# Patient Record
Sex: Male | Born: 1980 | Hispanic: No | Marital: Single | Smoking: Never smoker
Health system: Southern US, Community
[De-identification: ages and names within clinical notes are randomized; demographics above are authoritative.]

## PROBLEM LIST (undated history)

## (undated) DIAGNOSIS — K219 Gastro-esophageal reflux disease without esophagitis: Secondary | ICD-10-CM

## (undated) DIAGNOSIS — R519 Headache, unspecified: Secondary | ICD-10-CM

## (undated) DIAGNOSIS — G4730 Sleep apnea, unspecified: Secondary | ICD-10-CM

## (undated) DIAGNOSIS — D571 Sickle-cell disease without crisis: Secondary | ICD-10-CM

## (undated) DIAGNOSIS — J45909 Unspecified asthma, uncomplicated: Secondary | ICD-10-CM

## (undated) DIAGNOSIS — R51 Headache: Secondary | ICD-10-CM

## (undated) HISTORY — PX: TRACHEOSTOMY: SUR1362

## (undated) HISTORY — PX: OTHER SURGICAL HISTORY: SHX169

## (undated) HISTORY — PX: CHOLECYSTECTOMY: SHX55

---

## 2000-12-31 ENCOUNTER — Emergency Department (HOSPITAL_COMMUNITY): Admission: EM | Admit: 2000-12-31 | Discharge: 2000-12-31 | Payer: Self-pay

## 2008-09-03 ENCOUNTER — Ambulatory Visit: Payer: Self-pay

## 2010-05-17 ENCOUNTER — Ambulatory Visit: Payer: Self-pay | Admitting: Emergency Medicine

## 2011-01-07 ENCOUNTER — Emergency Department: Payer: Self-pay | Admitting: Emergency Medicine

## 2011-03-21 ENCOUNTER — Emergency Department: Payer: Self-pay | Admitting: Internal Medicine

## 2011-09-04 ENCOUNTER — Emergency Department: Payer: Self-pay | Admitting: Emergency Medicine

## 2012-05-24 ENCOUNTER — Emergency Department: Payer: Self-pay | Admitting: Emergency Medicine

## 2012-08-16 ENCOUNTER — Emergency Department: Payer: Self-pay | Admitting: Internal Medicine

## 2012-08-16 LAB — COMPREHENSIVE METABOLIC PANEL
Albumin: 4.1 g/dL (ref 3.4–5.0)
Alkaline Phosphatase: 83 U/L (ref 50–136)
Anion Gap: 4 — ABNORMAL LOW (ref 7–16)
BUN: 7 mg/dL (ref 7–18)
Co2: 29 mmol/L (ref 21–32)
Creatinine: 0.75 mg/dL (ref 0.60–1.30)
EGFR (African American): >60
Glucose: 101 mg/dL — ABNORMAL HIGH (ref 65–99)
Osmolality: 276 (ref 275–301)
SGPT (ALT): 60 U/L (ref 12–78)
Sodium: 139 mmol/L (ref 136–145)
Total Protein: 8.4 g/dL — ABNORMAL HIGH (ref 6.4–8.2)

## 2012-08-16 LAB — CBC
HCT: 35.9 % — ABNORMAL LOW (ref 40.0–52.0)
MCH: 34.5 pg — ABNORMAL HIGH (ref 26.0–34.0)
MCHC: 34.7 g/dL (ref 32.0–36.0)
MCV: 99 fL (ref 80–100)
RBC: 3.62 10*6/uL — ABNORMAL LOW (ref 4.40–5.90)

## 2012-08-16 LAB — RETICULOCYTES
Absolute Retic Count: 0.0703 10*6/uL
Reticulocyte: 1.94 %

## 2012-08-17 LAB — URINALYSIS, COMPLETE
Bacteria: NONE SEEN
Bilirubin,UR: NEGATIVE
Blood: NEGATIVE
Glucose,UR: NEGATIVE mg/dL (ref 0–75)
Ketone: NEGATIVE
Nitrite: NEGATIVE
Ph: 6 (ref 4.5–8.0)
Protein: NEGATIVE
RBC,UR: 1 /HPF (ref 0–5)
Squamous Epithelial: NONE SEEN
WBC UR: <1 /HPF (ref 0–5)

## 2013-07-14 LAB — COMPREHENSIVE METABOLIC PANEL
ALT: 34 U/L (ref 12–78)
ANION GAP: 6 — AB (ref 7–16)
Albumin: 4.3 g/dL (ref 3.4–5.0)
Alkaline Phosphatase: 75 U/L
BUN: 12 mg/dL (ref 7–18)
Bilirubin,Total: 1.2 mg/dL — ABNORMAL HIGH (ref 0.2–1.0)
CHLORIDE: 109 mmol/L — AB (ref 98–107)
CREATININE: 0.86 mg/dL (ref 0.60–1.30)
Calcium, Total: 9.6 mg/dL (ref 8.5–10.1)
Co2: 26 mmol/L (ref 21–32)
EGFR (African American): >60
EGFR (Non-African Amer.): >60
GLUCOSE: 114 mg/dL — AB (ref 65–99)
OSMOLALITY: 282 (ref 275–301)
Potassium: 3.8 mmol/L (ref 3.5–5.1)
SGOT(AST): 32 U/L (ref 15–37)
Sodium: 141 mmol/L (ref 136–145)
TOTAL PROTEIN: 8.5 g/dL — AB (ref 6.4–8.2)

## 2013-07-14 LAB — CBC WITH DIFFERENTIAL/PLATELET
BASOS PCT: 0.3 %
Basophil #: 0.1 10*3/uL (ref 0.0–0.1)
Eosinophil #: 0 10*3/uL (ref 0.0–0.7)
Eosinophil %: 0.1 %
HCT: 42.7 % (ref 40.0–52.0)
HGB: 13.9 g/dL (ref 13.0–18.0)
Lymphocyte #: 0.4 10*3/uL — ABNORMAL LOW (ref 1.0–3.6)
Lymphocyte %: 2.1 %
MCH: 24.8 pg — AB (ref 26.0–34.0)
MCHC: 32.5 g/dL (ref 32.0–36.0)
MCV: 76 fL — ABNORMAL LOW (ref 80–100)
MONO ABS: 0.7 x10 3/mm (ref 0.2–1.0)
Monocyte %: 3 %
Neutrophil #: 20.5 10*3/uL — ABNORMAL HIGH (ref 1.4–6.5)
Neutrophil %: 94.5 %
Platelet: 393 10*3/uL (ref 150–440)
RBC: 5.6 10*6/uL (ref 4.40–5.90)
RDW: 15.9 % — AB (ref 11.5–14.5)
WBC: 21.7 10*3/uL — ABNORMAL HIGH (ref 3.8–10.6)

## 2013-07-14 LAB — RETICULOCYTES
ABSOLUTE RETIC COUNT: 0.195 10*6/uL — AB (ref 0.019–0.186)
Reticulocyte: 3.5 % — ABNORMAL HIGH (ref 0.4–3.1)

## 2013-07-15 ENCOUNTER — Inpatient Hospital Stay: Payer: Self-pay | Admitting: Internal Medicine

## 2013-07-15 LAB — URINALYSIS, COMPLETE
Bacteria: NONE SEEN
Bilirubin,UR: NEGATIVE
GLUCOSE, UR: NEGATIVE mg/dL (ref 0–75)
Leukocyte Esterase: NEGATIVE
Nitrite: NEGATIVE
PROTEIN: NEGATIVE
Ph: 6 (ref 4.5–8.0)
Specific Gravity: 1.008 (ref 1.003–1.030)
Squamous Epithelial: NONE SEEN
WBC UR: 1 /HPF (ref 0–5)

## 2013-07-15 LAB — LACTATE DEHYDROGENASE: LDH: 271 U/L — ABNORMAL HIGH (ref 85–241)

## 2013-07-16 LAB — CLOSTRIDIUM DIFFICILE(ARMC)

## 2013-07-16 LAB — CBC WITH DIFFERENTIAL/PLATELET
Basophil #: 0 10*3/uL (ref 0.0–0.1)
Basophil %: 0.5 %
EOS ABS: 0.1 10*3/uL (ref 0.0–0.7)
Eosinophil %: 1.3 %
HCT: 36.5 % — ABNORMAL LOW (ref 40.0–52.0)
HGB: 12.3 g/dL — ABNORMAL LOW (ref 13.0–18.0)
LYMPHS ABS: 1.3 10*3/uL (ref 1.0–3.6)
Lymphocyte %: 15.1 %
MCH: 25.5 pg — ABNORMAL LOW (ref 26.0–34.0)
MCHC: 33.7 g/dL (ref 32.0–36.0)
MCV: 76 fL — ABNORMAL LOW (ref 80–100)
MONO ABS: 1.2 x10 3/mm — AB (ref 0.2–1.0)
Monocyte %: 14 %
NEUTROS PCT: 69.1 %
Neutrophil #: 6.1 10*3/uL (ref 1.4–6.5)
Platelet: 278 10*3/uL (ref 150–440)
RBC: 4.82 10*6/uL (ref 4.40–5.90)
RDW: 15.7 % — ABNORMAL HIGH (ref 11.5–14.5)
WBC: 8.9 10*3/uL (ref 3.8–10.6)

## 2013-07-16 LAB — COMPREHENSIVE METABOLIC PANEL
ANION GAP: 7 (ref 7–16)
Albumin: 3.3 g/dL — ABNORMAL LOW (ref 3.4–5.0)
Alkaline Phosphatase: 51 U/L
BILIRUBIN TOTAL: 0.8 mg/dL (ref 0.2–1.0)
BUN: 6 mg/dL — ABNORMAL LOW (ref 7–18)
CHLORIDE: 106 mmol/L (ref 98–107)
CREATININE: 0.87 mg/dL (ref 0.60–1.30)
Calcium, Total: 8.6 mg/dL (ref 8.5–10.1)
Co2: 25 mmol/L (ref 21–32)
EGFR (African American): >60
Glucose: 101 mg/dL — ABNORMAL HIGH (ref 65–99)
Osmolality: 273 (ref 275–301)
POTASSIUM: 3.1 mmol/L — AB (ref 3.5–5.1)
SGOT(AST): 37 U/L (ref 15–37)
SGPT (ALT): 33 U/L (ref 12–78)
SODIUM: 138 mmol/L (ref 136–145)
Total Protein: 6.8 g/dL (ref 6.4–8.2)

## 2013-07-16 LAB — MAGNESIUM: Magnesium: 1.7 mg/dL — ABNORMAL LOW

## 2013-07-18 LAB — BASIC METABOLIC PANEL
Anion Gap: 1 — ABNORMAL LOW (ref 7–16)
BUN: 4 mg/dL — AB (ref 7–18)
Calcium, Total: 8.7 mg/dL (ref 8.5–10.1)
Chloride: 110 mmol/L — ABNORMAL HIGH (ref 98–107)
Co2: 28 mmol/L (ref 21–32)
Creatinine: 0.91 mg/dL (ref 0.60–1.30)
EGFR (African American): >60
EGFR (Non-African Amer.): >60
Glucose: 86 mg/dL (ref 65–99)
Osmolality: 274 (ref 275–301)
Potassium: 3.5 mmol/L (ref 3.5–5.1)
SODIUM: 139 mmol/L (ref 136–145)

## 2013-07-19 LAB — STOOL CULTURE

## 2014-07-09 ENCOUNTER — Ambulatory Visit: Payer: Self-pay | Admitting: Family Medicine

## 2014-08-29 NOTE — H&P (Signed)
PATIENT NAME:  Joel Leblanc, FRISBEE MR#:  [id number] DATE OF BIRTH:  18-Jun-1980  DATE OF ADMISSION:  07/15/2013  PRIMARY CARE PHYSICIAN: Nonlocal.  REFERRING PHYSICIAN: Rebecca L. Lord, MD  CHIEF COMPLAINT: One-day history of nausea, vomiting and diarrhea with flank pain and low back pain.   HISTORY OF PRESENT ILLNESS: The patient is a 34 year old African-American male with past medical history of sickle cell disease, hypertension, migraine and obesity, who is presenting to the ER with a chief complaint of 1-day history of intractable nausea, vomiting and diarrhea. He also developed low back pain and flank pain subsequently. The patient denies any fever, sick contacts or recent travel. As he could not keep any food down, he came into the ER. White count is elevated at 21.7, and lactic acid is elevated at 2.0. Reticulocyte count is at 3.5, absolute reticulocyte count is also elevated at 0.195. During my examination, the patient is complaining of low back pain and cramps in his extremities. Asking for more IV fluids to be started and pain medicines. Resting comfortably.   PAST MEDICAL HISTORY: Sickle cell disease, hypertension, migraine and obesity.   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   ALLERGIES: LYRICA, MORPHINE, LATEX.   PSYCHOSOCIAL HISTORY: Lives at home with family. No history of smoking, alcohol or illicit drug usage.   FAMILY HISTORY: Both parents have sickle cell trait. Mom has history of diabetes mellitus.   HOME MEDICATIONS:  1. Roxicodone 30 mg 1 capsule p.o. every 6 hours. 2. Neurontin 400 mg p.o. 4 times a day.  3. Naproxen 500 mg 1 tablet 2 times a day. 4. Flexeril 10 mg p.o. 3 times a day.   REVIEW OF SYSTEMS:  CONSTITUTIONAL: Denies any fever. Complaining of fatigue and weakness. Complaining of low back pain. Denies any weight loss or weight gain.  EYES: Denies blurry vision or double vision.   ENT: Denies epistaxis, discharge.   RESPIRATION: Denies cough, COPD.  CARDIOVASCULAR: No chest pain or palpitations.  GASTROINTESTINAL: Complaining of nausea, vomiting and diarrhea for 1 day, abdominal discomfort. Denies any hematemesis or melena.  GENITOURINARY: No dysuria or hematuria.  ENDOCRINE: Denies polyuria, nocturia, thyroid problems.  HEMATOLOGIC AND LYMPHATIC: No easy bruising or bleeding. 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  INTEGUMENTARY: No acne, rash, lesions.  MUSCULOSKELETAL: Complaining of low back pain and upper and lower extremity pain. Denies any chest pain. NEUROLOGIC: Denies any vertigo or ataxia.  PSYCHIATRIC: No ADD, OCD.   PHYSICAL EXAMINATION:  VITAL SIGNS: Temperature 97.8, pulse 99, respirations 20, blood pressure 122/70, pulse oximetry 98%.  GENERAL APPEARANCE: Not under acute distress. Moderately built and nourished.  HEENT: Normocephalic, atraumatic. Pupils are equally reacting to light and accommodation. No scleral icterus. No conjunctival injection. No sinus tenderness. No postnasal drip.  NECK: Supple. No JVD. No thyromegaly. Range of motion is intact.  LUNGS: Clear to auscultation bilaterally. No accessory muscle usage. No anterior chest wall tenderness on palpation.  CARDIAC: S1, S2 normal. Regular rate and rhythm. Positive murmur.  GASTROINTESTINAL: Soft. Bowel sounds are positive in all 4 quadrants. Nontender, nondistended. No hepatosplenomegaly. No masses felt.  NEUROLOGIC: Awake, alert and oriented x3. Cranial nerves II through XII are grossly intact. Motor and sensory are intact. Reflexes are 2+.  BACK AND EXTREMITIES: Diffuse tenderness is present in the lower back area. Also, diffuse tenderness is present in the extremities.   LABORATORY AND IMAGING STUDIES: WBC is elevated at 21.7, hemoglobin, hematocrit and platelets are normal. Reticulocyte  count is elevated at 3.5, absolute reticulocyte count is 0.195. Lactic acid is elevated at 2.0. Total bilirubin is 1.2, total protein  8.5, AST, ALT and alkaline phosphatase are normal. Serum albumin is also normal. CHEM-8 has revealed glucose of 114. Sodium, potassium, BUN and creatinine are normal. Chloride is at 109, GFR greater than 60, anion gap 6, serum osmolality and calcium are normal. Chest x-ray, portable: Mild peribronchial thickening, can be seen with bronchitis or sequelae of sickle cell. No focal consolidation.   ASSESSMENT AND PLAN:  1. Acute gastroenteritis, probably viral etiology. Will provide him IV fluids and nausea medications. As of now, he is reporting that diarrhea is resolved. If the patient continues to have diarrhea, will consider stool tests.  2. Sickle cell crisis. Reticulocyte count is elevated. Will provide him oxygen 2 liters via nasal cannula.  Additional hydration with IV fluids.  Pain management with IV morphine.  3. Leukocytosis, could be from acute gastroenteritis, sickle cell crisis and acute bronchitis. Blood cultures are pending. Will provide him empiric antibiotic, IV Rocephin and azithromycin.   4. Hypertension. Will resume his home medications.  5. Migraines. Currently, the patient denies any migraine.  6. Obesity. The patient will be benefitted with diet and lifestyle changes.   CODE STATUS: He is full code.   Plan of care discussed in detail with the patient. He is aware of the plan.   TOTAL TIME SPENT ON ADMISSION: 45 minutes.    ____________________________ Ramonita LabAruna Moksh Loomer, MD ag:lb D: 07/15/2013 01:36:55 ET T: 07/15/2013 05:50:46 ET JOB#: [id number]  cc: Ramonita LabAruna Donnamae Mu[place], MD, <Dictator> Primary Care Physician  Ramonita LabARUNA Sherica Paternostro MD ELECTRONICALLY SIGNED 08/07/2013 2:46

## 2014-08-29 NOTE — Discharge Summary (Signed)
Dates of Admission and Diagnosis:  Date of Admission 15-Jul-2013   Date of Discharge 18-Jul-2013   Admitting Diagnosis sickle cell crisis   Final Diagnosis Campylobacter jejuni diarrhea Sickle cell pain crisis Htn Migrain headache.    Chief Complaint/History of Present Illness The patient is a 34 year old African-American male with past medical history of sickle cell disease, hypertension, migraine and obesity, who is presenting to the ER with a chief complaint of 1-day history of intractable nausea, vomiting and diarrhea. He also developed low back pain and flank pain subsequently. The patient denies any fever, sick contacts or recent travel. As he could not keep any food down, he came into the ER. White count is elevated at 21.7, and lactic acid is elevated at 2.0. Reticulocyte count is at 3.5, absolute reticulocyte count is also elevated at 0.195. During my examination, the patient is complaining of low back pain and cramps in his extremities. Asking for more IV fluids to be started and pain medicines. Resting comfortably.   Allergies:  Morphine: Hives  Lyrica: Unknown  Latex: Unknown  Hepatic:  09-Mar-15 20:01   Bilirubin, Total  1.2  Alkaline Phosphatase 75 (45-117 NOTE: New Reference Range 03/28/13)  SGPT (ALT) 34  SGOT (AST) 32  Total Protein, Serum  8.5  Albumin, Serum 4.3  Routine Micro:  11-Mar-15 15:45   Micro Text Report CLOSTRIDIUM DIFFICILE   C.DIFFICILE ANTIGEN       C.DIFFICILE GDH ANTIGEN : NEGATIVE   C.DIFFICILE TOXIN A/B     C.DIFFICILE TOXINS A AND B : NEGATIVE   INTERPRETATION            Negative for C. difficile.    ANTIBIOTIC                        Micro Text Report STOOL COMPREHENSIVE   COMMENT                   NO SALMONELLA OR SHIGELLA ISOLATED   COMMENT                   NO PATHOGENIC E.COLI DETECTED   COMMENT                   POSITIVE CAMPYLOBACTER AG (JEJUNI/COLI)   ANTIBIOTIC                        Culture Comment NO SALMONELLA OR SHIGELLA  ISOLATED  Culture Comment . NO PATHOGENIC E.COLI DETECTED  Culture Comment    . POSITIVE CAMPYLOBACTER AG (JEJUNI/COLI)  Routine Chem:  09-Mar-15 20:01   Glucose, Serum  114  BUN 12  Creatinine (comp) 0.86  Sodium, Serum 141  Potassium, Serum 3.8  Chloride, Serum  109  CO2, Serum 26  Calcium (Total), Serum 9.6  Anion Gap  6  Osmolality (calc) 282  eGFR (African American) >60  eGFR (Non-African American) >60 (eGFR values <16m/min/1.73 m2 may be an indication of chronic kidney disease (CKD). Calculated eGFR is useful in patients with stable renal function. The eGFR calculation will not be reliable in acutely ill patients when serum creatinine is changing rapidly. It is not useful in  patients on dialysis. The eGFR calculation may not be applicable to patients at the low and high extremes of body sizes, pregnant women, and vegetarians.)  11-Mar-15 15:45   Result Comment POSITIVE CAMPY - NOTIFIED OF CRITICAL VALUE  - CTJ TO KAREN JONES @ 1342 ON 07-17-13  -  READ-BACK PROCESS PERFORMED.  - PRINTED TO INFECTION CONTROL.  - FAXED TO [place].  Result(s) reported on 19 Jul 2013 at 10:56AM.  Routine Hem:  09-Mar-15 20:01   WBC (CBC)  21.7  RBC (CBC) 5.60  Hemoglobin (CBC) 13.9  Hematocrit (CBC) 42.7  Platelet Count (CBC) 393  MCV  76  MCH  24.8  MCHC 32.5  RDW  15.9  Neutrophil % 94.5  Lymphocyte % 2.1  Monocyte % 3.0  Eosinophil % 0.1  Basophil % 0.3  Neutrophil #  20.5  Lymphocyte #  0.4  Monocyte # 0.7  Eosinophil # 0.0  Basophil # 0.1 (Result(s) reported on 14 Jul 2013 at 08:43PM.)  Retic Count  3.5  Absolute Retic Count  0.195 (Result(s) reported on 14 Jul 2013 at 11:12PM.)   Pertinent Past History:  Pertinent Past History sickle cell, htn, Migrain, Obasity.   Hospital Course:  Hospital Course 1. Acute gastroenteritis,- campylobacter . IV fluids and nausea medications. resolved diarrhea - stool tests negative for c diff. reported positive for  campylobacter- swiched to cipro+ flagyl. added loperamide. now only mild nausea and abdominal pain- but feels much better than he came. 2. Sickle cell crisis. Reticulocyte count is elevated. was on oxygen- later came on room air.  hydration with IV fluids. Hb stable.elevated LDH    Pain management with IV morphine.  3. Leukocytosis, could be from acute gastroenteritis, sickle cell crisis - Blood cultures are negative.  stopped, IV Rocephin and azithromycin.   4. Hypertension. resumed his home medications.  5. Migraines. in hospital , the patient denied any migraine.   Condition on Discharge Stable   Code Status:  Code Status Full Code   DISCHARGE INSTRUCTIONS HOME MEDS:  Medication Reconciliation: Patient's Home Medications at Discharge:     Medication Instructions  flexeril  10 milligram(s) orally 3 times a day, As Needed - for Pain   neurontin 400 mg oral capsule  1 cap(s) orally 4 times a day   naproxen 500 mg oral tablet  1 tab(s) orally 2 times a day, As Needed - for Pain   oxycodone 10 mg oral tablet, extended release  1 tab(s) orally every 12 hours x 7 days   loperamide 2 mg oral capsule  1 cap(s) orally 4 times a day, As Needed, diarrhea , As needed, diarrhea   percocet 10/325 325 mg-10 mg oral tablet  1 tab(s) orally every 6 hours, As Needed - for Pain   zofran odt 4 mg oral tablet, disintegrating  1 tab(s) orally 3 times a day x 5 days, As Needed - for Nausea, Vomiting   promethazine 12.5 mg oral tablet  1 tab(s) orally every 6 hours, As Needed - for Nausea, Vomiting   ciprofloxacin 250 mg oral tablet  1 tab(s) orally every 12 hours x 4 days     Physician's Instructions:  Diet Low Sodium   Activity Limitations As tolerated   Return to Work Not Applicable   Time frame for Follow Up Appointment 2-4 weeks   Other Comments routine follow ups with sickle cell clinic.   Electronic Signatures: Vaughan Basta (MD)  (Signed 17-Mar-15 22:20)  Authored: ADMISSION  DATE AND DIAGNOSIS, CHIEF COMPLAINT/HPI, Allergies, PERTINENT LABS, PERTINENT PAST HISTORY, HOSPITAL COURSE, [place] [facility], PATIENT INSTRUCTIONS   Last Updated: 17-Mar-15 22:20 by Vaughan Basta (MD)

## 2016-10-05 ENCOUNTER — Ambulatory Visit: Payer: Medicare Other

## 2016-10-05 ENCOUNTER — Ambulatory Visit
Admission: EM | Admit: 2016-10-05 | Discharge: 2016-10-05 | Disposition: A | Discharge status: Home / Self Care | Payer: Medicare Other | Attending: Family Medicine | Admitting: Family Medicine

## 2016-10-05 DIAGNOSIS — M21379 Foot drop, unspecified foot: Secondary | ICD-10-CM | POA: Diagnosis not present

## 2016-10-05 DIAGNOSIS — D571 Sickle-cell disease without crisis: Secondary | ICD-10-CM | POA: Diagnosis not present

## 2016-10-05 DIAGNOSIS — G629 Polyneuropathy, unspecified: Secondary | ICD-10-CM | POA: Insufficient documentation

## 2016-10-05 DIAGNOSIS — S92351A Displaced fracture of fifth metatarsal bone, right foot, initial encounter for closed fracture: Secondary | ICD-10-CM | POA: Diagnosis not present

## 2016-10-05 DIAGNOSIS — S99921A Unspecified injury of right foot, initial encounter: Secondary | ICD-10-CM | POA: Diagnosis present

## 2016-10-05 DIAGNOSIS — W109XXA Fall (on) (from) unspecified stairs and steps, initial encounter: Secondary | ICD-10-CM | POA: Diagnosis not present

## 2016-10-05 DIAGNOSIS — S9031XA Contusion of right foot, initial encounter: Secondary | ICD-10-CM

## 2016-10-05 HISTORY — DX: Sickle-cell disease without crisis: D57.1

## 2016-10-05 MED ORDER — KETOROLAC TROMETHAMINE 60 MG/2ML IM SOLN
60.0000 mg | Freq: Once | INTRAMUSCULAR | Status: AC
  Administered 2016-10-05: 60 mg via INTRAMUSCULAR

## 2016-10-05 NOTE — ED Triage Notes (Signed)
Pt fell down the stairs today and he has sickle cell, and foot drop also and the pain is like a tooth ache.

## 2016-10-05 NOTE — ED Provider Notes (Addendum)
[facility]    CSN: [id number] Arrival date & time: 10/05/16  1726     History   Chief Complaint Chief Complaint  Patient presents with  . Foot Injury    right    HPI Joel Leblanc is a 36 y.o. male.   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 He was not wearing his foot brace like he usually does and he was over his girlfriends mother's house bring something to her when he stepped fell down steps. He reports bending or partially hyperextending his right foot. Since then he's had difficulty walking on his right foot. He normally has trouble with neuropathy and loss of sensation in his lower extremities but he can feel the pain in his foot and difficulty ambulating. He does have 60 diseases had a trach he's had renal failure Restasis canes back tonight so functioning now. He is on pain management. He never smoked. He is allergic to Lyrica latex and morphine.'s on Percocet or oxycodone and methadone for pain management   The history is provided by the patient. No language interpreter was used.  Foot Injury  Location:  Foot Time since incident:  4 hours Injury: yes   Mechanism of injury: fall   Foot location:  R foot and dorsum of R foot Pain details:    Quality:  Shooting, throbbing, sharp and burning   Radiates to:  Does not radiate   Severity:  Moderate   Onset quality:  Sudden   Timing:  Constant   Progression:  Worsening Chronicity:  New Dislocation: no   Foreign body present:  No foreign bodies Worsened by:  Bearing weight Ineffective treatments:  None tried   Past Medical History:  Diagnosis Date  . Sickle cell anemia (HCC)     There are no active problems to display for this patient.   History reviewed. No pertinent surgical history.     Home Medications    Prior to Admission medications   Medication Sig Start Date End Date Taking? Authorizing Provider  albuterol  (PROVENTIL HFA;VENTOLIN HFA) 108 (90 Base) MCG/ACT inhaler Inhale into the lungs every 6 (six) hours as needed for wheezing or shortness of breath.   Yes [provider]  atomoxetine (STRATTERA) 100 MG capsule Take 100 mg by mouth daily.   Yes [provider]  budesonide-formoterol (SYMBICORT) 160-4.5 MCG/ACT inhaler Inhale 2 puffs into the lungs 2 (two) times daily.   Yes [provider]  Dexlansoprazole (DEXILANT PO) Take 30 mg by mouth.   Yes [provider]  gabapentin (NEURONTIN) 600 MG tablet Take 600 mg by mouth 3 (three) times daily.   Yes [provider]  methadone (DOLOPHINE) 10 MG tablet Take 10 mg by mouth 2 (two) times daily.   Yes [provider]  oxyCODONE (ROXICODONE) 15 MG immediate release tablet Take 15 mg by mouth every 4 (four) hours as needed for pain.   Yes [provider]  promethazine (PHENERGAN) 25 MG tablet Take 25 mg by mouth as needed for nausea or vomiting.   Yes [provider]  zolpidem (AMBIEN) 10 MG tablet Take 10 mg by mouth at bedtime as needed for sleep.   Yes [provider]    Family History History reviewed. No pertinent family history.  Social History Social History  Substance Use Topics  . Smoking status: Never Smoker  . Smokeless tobacco: Never Used  . Alcohol use No  Allergies   Latex; Lyrica [pregabalin]; and Morphine and related   Review of Systems Review of Systems  Musculoskeletal: Positive for gait problem, joint swelling and myalgias.  All other systems reviewed and are negative.    Physical Exam Triage Vital Signs ED Triage Vitals  Enc Vitals Group     BP 10/05/16 1819 (!) 150/92     Pulse Rate 10/05/16 1819 (!) 113     Resp 10/05/16 1819 18     Temp 10/05/16 1819 98.2 F (36.8 C)     Temp Source 10/05/16 1819 Oral     SpO2 10/05/16 1819 99 %     Weight 10/05/16 1820 216 lb (98 kg)     Height 10/05/16 1820 6' (1.829 m)     Head  Circumference --      Peak Flow --      Pain Score 10/05/16 1820 5     Pain Loc --      Pain Edu? --      Excl. in GC? --    No data found.   Updated Vital Signs BP (!) 150/92 (BP Location: Left Arm)   Pulse (!) 113   Temp 98.2 F (36.8 C) (Oral)   Resp 18   Ht 6' (1.829 m)   Wt 216 lb (98 kg)   SpO2 99%   BMI 29.29 kg/m   Visual Acuity Right Eye Distance:   Left Eye Distance:   Bilateral Distance:    Right Eye Near:   Left Eye Near:    Bilateral Near:     Physical Exam  Constitutional: He is oriented to person, place, and time. He appears well-developed and well-nourished.  HENT:  Head: Normocephalic and atraumatic.  Right Ear: External ear normal.  Left Ear: External ear normal.  Eyes: EOM are normal. Pupils are equal, round, and reactive to light.  Neck: Normal range of motion. Neck supple.  Pulmonary/Chest: Breath sounds normal.  Musculoskeletal: He exhibits tenderness.       Right foot: There is decreased range of motion, tenderness, bony tenderness and swelling.       Feet:  Patient is tenderness over the fifth metatarsal bone with minimal displacement present Fracture  Neurological: He is alert and oriented to person, place, and time.  Skin: Skin is warm.  Psychiatric: He has a normal mood and affect.  Vitals reviewed.    UC Treatments / Results  Labs (all labs ordered are listed, but only abnormal results are displayed) Labs Reviewed - No data to display  EKG  EKG Interpretation None       Radiology Dg Foot Complete Right  Result Date: 10/05/2016 CLINICAL DATA:  Patient fell down steps and twisted foot. History of sickle cell. EXAM: RIGHT FOOT COMPLETE - 3+ VIEW COMPARISON:  None. FINDINGS: Acute, closed fifth metatarsal neck fracture without intra-articular involvement. No significant displacement. Mild dorsal angulation of the distal fracture fragment suggested on the lateral projection. IMPRESSION: Acute, closed fracture of the right  fifth metatarsal neck with slight dorsal angulation of the distal fracture fragment. Electronically Signed   By: Tollie Ethavid  Kwon M.D.   On: 10/05/2016 19:25    Procedures Procedures (including critical care time)  Medications Ordered in UC Medications  ketorolac (TORADOL) injection 60 mg (60 mg Intramuscular Given 10/05/16 1904)     Initial Impression / Assessment and Plan / UC Course  I have reviewed the triage vital signs and the nursing notes.  Pertinent labs & imaging results that were available  during my care of the patient were reviewed by me and considered in my medical decision making (see chart for details).     Patient's fraction the fifth metatarsal is given 60 Toradol for pain results were available. We'll give him a cam boot to use recommend podiatry follow-up symptoms possible next week.  Final Clinical Impressions(s) / UC Diagnoses   Final diagnoses:  Contusion of right foot, initial encounter  Closed displaced fracture of fifth metatarsal bone of right foot, initial encounter    New Prescriptions New Prescriptions   No medications on file     Note: This dictation was prepared with Dragon dictation along with smaller phrase technology. Any transcriptional errors that result from this process are unintentional.   Hassan Rowan, MD 10/05/16 1936    Hassan Rowan, MD 10/05/16 [phone]

## 2016-10-30 ENCOUNTER — Inpatient Hospital Stay
Admission: EM | Admit: 2016-10-30 | Discharge: 2016-10-31 | DRG: 812 | Disposition: A | Discharge status: Other Acute Inpatient Hospital | Payer: Medicare Other | Attending: Internal Medicine | Admitting: Internal Medicine

## 2016-10-30 ENCOUNTER — Encounter: Payer: Self-pay | Admitting: *Deleted

## 2016-10-30 DIAGNOSIS — D5700 Hb-SS disease with crisis, unspecified: Secondary | ICD-10-CM | POA: Diagnosis present

## 2016-10-30 DIAGNOSIS — G43909 Migraine, unspecified, not intractable, without status migrainosus: Secondary | ICD-10-CM | POA: Diagnosis present

## 2016-10-30 DIAGNOSIS — Z7951 Long term (current) use of inhaled steroids: Secondary | ICD-10-CM

## 2016-10-30 DIAGNOSIS — Z79899 Other long term (current) drug therapy: Secondary | ICD-10-CM

## 2016-10-30 LAB — COMPREHENSIVE METABOLIC PANEL
ALT: 21 U/L (ref 17–63)
AST: 26 U/L (ref 15–41)
Albumin: 4.2 g/dL (ref 3.5–5.0)
Alkaline Phosphatase: 68 U/L (ref 38–126)
Anion gap: 6 (ref 5–15)
BUN: 12 mg/dL (ref 6–20)
CO2: 26 mmol/L (ref 22–32)
CREATININE: 0.89 mg/dL (ref 0.61–1.24)
Calcium: 9.3 mg/dL (ref 8.9–10.3)
Chloride: 105 mmol/L (ref 101–111)
GFR calc Af Amer: >60 mL/min (ref >60)
GFR calc non Af Amer: >60 mL/min (ref >60)
Glucose, Bld: 112 mg/dL — ABNORMAL HIGH (ref 65–99)
Potassium: 3.5 mmol/L (ref 3.5–5.1)
SODIUM: 137 mmol/L (ref 135–145)
Total Bilirubin: 1 mg/dL (ref 0.3–1.2)
Total Protein: 7.9 g/dL (ref 6.5–8.1)

## 2016-10-30 LAB — CBC WITH DIFFERENTIAL/PLATELET
Basophils Absolute: 0.1 10*3/uL (ref 0–0.1)
Basophils Relative: 1 %
EOS ABS: 0.2 10*3/uL (ref 0–0.7)
EOS PCT: 1 %
HCT: 37.5 % — ABNORMAL LOW (ref 40.0–52.0)
HEMOGLOBIN: 12.9 g/dL — AB (ref 13.0–18.0)
Lymphocytes Relative: 27 %
Lymphs Abs: 5.1 10*3/uL — ABNORMAL HIGH (ref 1.0–3.6)
MCH: 25 pg — AB (ref 26.0–34.0)
MCHC: 34.4 g/dL (ref 32.0–36.0)
MCV: 72.6 fL — AB (ref 80.0–100.0)
MONOS PCT: 8 %
Monocytes Absolute: 1.6 10*3/uL — ABNORMAL HIGH (ref 0.2–1.0)
NEUTROS PCT: 63 %
Neutro Abs: 12 10*3/uL — ABNORMAL HIGH (ref 1.4–6.5)
Platelets: 519 10*3/uL — ABNORMAL HIGH (ref 150–440)
RBC: 5.17 MIL/uL (ref 4.40–5.90)
RDW: 15.5 % — ABNORMAL HIGH (ref 11.5–14.5)
WBC: 19.1 10*3/uL — ABNORMAL HIGH (ref 3.8–10.6)

## 2016-10-30 LAB — RETICULOCYTES
RBC.: 5.17 MIL/uL (ref 4.40–5.90)
RETIC CT PCT: 3.7 % — AB (ref 0.4–3.1)
Retic Count, Absolute: 191.3 10*3/uL — ABNORMAL HIGH (ref 19.0–183.0)

## 2016-10-30 MED ORDER — HYDROMORPHONE HCL 1 MG/ML IJ SOLN
1.0000 mg | Freq: Once | INTRAMUSCULAR | Status: AC
  Administered 2016-10-30: 1 mg via INTRAVENOUS
  Filled 2016-10-30: qty 1

## 2016-10-30 MED ORDER — SODIUM CHLORIDE 0.9 % IV BOLUS (SEPSIS)
1000.0000 mL | Freq: Once | INTRAVENOUS | Status: AC
  Administered 2016-10-30: 1000 mL via INTRAVENOUS

## 2016-10-30 MED ORDER — PROCHLORPERAZINE EDISYLATE 5 MG/ML IJ SOLN
INTRAMUSCULAR | Status: AC
  Administered 2016-10-30: 10 mg
  Filled 2016-10-30: qty 2

## 2016-10-30 NOTE — ED Notes (Signed)
Pt reports having a sickle cell crisis. Also reports he is having a migraine at the moment. He took two Amitrex this morning but it's not working.

## 2016-10-30 NOTE — ED Provider Notes (Signed)
[facility] Emergency Department Provider Note  ____________________________________________   First MD Initiated Contact with Patient 10/30/16 2301     (approximate)  I have reviewed the triage vital signs and the nursing notes.   HISTORY  Chief Complaint Sickle Cell Pain Crisis   [facility] a 36 y.o. male with a history of hemoglobin SS disease who is presenting to the emergency department today with 1-2 weeks of worsening cramping lower extremity pain which she says is typical for his sickle cell disease. He says that he also has cramping to the left hand which is also common for him. He says that in the summer months as well as with rain that his pain worsens. He says that he has been taking oxycodone at home without relief. He is no longer on methadone as of his last visit on June 4 to his pain medicine specialist. He is also reporting a posterior headache which is a 7 out of 10 which is also been persistent over the past 1-2 weeks. He has tried Imitrex twice today without any relief. Does not report any nausea or vomiting. Says that the symptoms are also consistent with his typical migraine.   Past Medical History:  Diagnosis Date  . Sickle cell anemia [facility](HCC)     Patient Active Problem List   Diagnosis Date Noted  . Sickle cell pain crisis (HCC) 10/31/2016    History reviewed. No pertinent surgical history.  Prior to Admission medications   Medication Sig Start Date End Date Taking? Authorizing Provider  albuterol (PROVENTIL HFA;VENTOLIN HFA) 108 (90 Base) MCG/ACT inhaler Inhale into the lungs every 6 (six) hours as needed for wheezing or shortness of breath.    [provider]  atomoxetine (STRATTERA) 100 MG capsule Take 100 mg by mouth daily.    [provider]  budesonide-formoterol (SYMBICORT) 160-4.5 MCG/ACT inhaler Inhale 2 puffs into the lungs 2 (two) times daily.    [provider]    Dexlansoprazole (DEXILANT PO) Take 30 mg by mouth.    [provider]  gabapentin (NEURONTIN) 600 MG tablet Take 600 mg by mouth 3 (three) times daily.    [provider]  methadone (DOLOPHINE) 10 MG tablet Take 10 mg by mouth 2 (two) times daily.    [provider]  oxyCODONE (ROXICODONE) 15 MG immediate release tablet Take 15 mg by mouth every 4 (four) hours as needed for pain.    [provider]  promethazine (PHENERGAN) 25 MG tablet Take 25 mg by mouth as needed for nausea or vomiting.    [provider]  zolpidem (AMBIEN) 10 MG tablet Take 10 mg by mouth at bedtime as needed for sleep.    [provider]    Allergies Latex; Lyrica [pregabalin]; and Morphine and related  No family history on file.  Social History Social History  Substance Use Topics  . Smoking status: Never Smoker  . Smokeless tobacco: Never Used  . Alcohol use No    Review of Systems  Constitutional: No fever/chills Eyes: No visual changes. ENT: No sore throat. Cardiovascular: Denies chest pain. Respiratory: Denies shortness of breath. Gastrointestinal: No abdominal pain.  No nausea, no vomiting.  No diarrhea.  No constipation. Genitourinary: Negative for dysuria. Musculoskeletal: Negative for back pain. Skin: Negative for rash. Neurological: Negative for focal weakness or numbness.   ____________________________________________   PHYSICAL EXAM:  VITAL SIGNS: ED Triage Vitals  Enc Vitals Group     BP 10/30/16  1957 (!) 157/91     Pulse Rate 10/30/16 1957 (!) 101     Resp 10/30/16 1957 20     Temp 10/30/16 1957 98 F (36.7 C)     Temp Source 10/30/16 1957 Oral     SpO2 10/30/16 1957 100 %     Weight 10/30/16 1954 217 lb (98.4 kg)     Height 10/30/16 1954 6' (1.829 m)     Head Circumference --      Peak Flow --      Pain Score 10/30/16 1954 6     Pain Loc --      Pain Edu? --      Excl. in GC? --     Constitutional: Alert and  oriented. Well appearing and in no acute distress. Eyes: Conjunctivae are normal.  Head: Atraumatic. Nose: No congestion/rhinnorhea. Mouth/Throat: Mucous membranes are moist.  Neck: No stridor.   Cardiovascular: Normal rate, regular rhythm. Grossly normal heart sounds.   Respiratory: Normal respiratory effort.  No retractions. Lungs CTAB. Gastrointestinal: Soft and nontender. No distention. No CVA tenderness. Musculoskeletal: No lower extremity edema.  No joint effusions.  Tenderness palpation of the bilateral thighs, anteriorly and posteriorly without any swelling. The compartments are soft. There is no erythema or induration. No tenderness to bilateral calves. Also with mild tenderness diffusely over the left hand without any swelling, effusions or deformity. Neurologic:  Normal speech and language. No gross focal neurologic deficits are appreciated. Skin:  Skin is warm, dry and intact. No rash noted. Psychiatric: Mood and affect are normal. Speech and behavior are normal.  ____________________________________________   LABS (all labs ordered are listed, but only abnormal results are displayed)  Labs Reviewed  COMPREHENSIVE METABOLIC PANEL - Abnormal; Notable for the following:       Result Value   Glucose, Bld 112 (*)    All other components within normal limits  CBC WITH DIFFERENTIAL/PLATELET - Abnormal; Notable for the following:    WBC 19.1 (*)    Hemoglobin 12.9 (*)    HCT 37.5 (*)    MCV 72.6 (*)    MCH 25.0 (*)    RDW 15.5 (*)    Platelets 519 (*)    Neutro Abs 12.0 (*)    Lymphs Abs 5.1 (*)    Monocytes Absolute 1.6 (*)    All other components within normal limits  RETICULOCYTES - Abnormal; Notable for the following:    Retic Ct Pct 3.7 (*)    Retic Count, Absolute 191.3 (*)    All other components within normal limits    ____________________________________________  EKG   ____________________________________________  RADIOLOGY   ____________________________________________   PROCEDURES  Procedure(s) performed:   Procedures  Critical Care performed:   ____________________________________________   INITIAL IMPRESSION / ASSESSMENT AND PLAN / ED COURSE  Pertinent labs & imaging results that were available during my care of the patient were reviewed by me and considered in my medical decision making (see chart for details).  Patient without signs of acute chest. Also denied fever. Likely exacerbation of his chronic pain. We will give fluids as well as Dilaudid and reassess. Patient also received Compazine with minimal relief of his headache.     ----------------------------------------- 2:57 AM on 10/31/2016 -----------------------------------------  Despite 2 doses of Dilaudid the patient is still 5 out of 10 pain. He'll be transferred to [facility] for sickle cell pain crisis. Does not have evidence of acute chest syndrome at this time. Discussed the case with Dr. Henry Russel,  who accepts the patient to his service. Patient is understanding the plan and willing to comply.  ____________________________________________   FINAL CLINICAL IMPRESSION(S) / ED DIAGNOSES  Final diagnoses:  Sickle cell pain crisis (HCC)      NEW MEDICATIONS STARTED DURING THIS VISIT:  New Prescriptions   No medications on file     Note:  This document was prepared using Dragon voice recognition software and may include unintentional dictation errors.     Myrna Blazer, MD 10/31/16 [phone]

## 2016-10-30 NOTE — ED Triage Notes (Signed)
Pt reports having a sickle cell crisis, pt reports bilateral leg pain for 3 days, pt reports having a migraine starting 1 week ago, pt reports a history of migraines, pt reports migraine is similar to previous migraines

## 2016-10-30 NOTE — ED Notes (Signed)
Verbal order compazine 10mg  per Dr. Derrill KayGoodman

## 2016-10-31 ENCOUNTER — Observation Stay (HOSPITAL_COMMUNITY)
Admission: AD | Admit: 2016-10-31 | Discharge: 2016-11-01 | Disposition: A | Discharge status: Home / Self Care | Payer: Medicare Other | Source: Other Acute Inpatient Hospital | Attending: Internal Medicine | Admitting: Internal Medicine

## 2016-10-31 ENCOUNTER — Encounter (HOSPITAL_COMMUNITY): Payer: Self-pay | Admitting: Internal Medicine

## 2016-10-31 DIAGNOSIS — K219 Gastro-esophageal reflux disease without esophagitis: Secondary | ICD-10-CM | POA: Diagnosis not present

## 2016-10-31 DIAGNOSIS — Z885 Allergy status to narcotic agent status: Secondary | ICD-10-CM | POA: Insufficient documentation

## 2016-10-31 DIAGNOSIS — D5720 Sickle-cell/Hb-C disease without crisis: Secondary | ICD-10-CM | POA: Diagnosis present

## 2016-10-31 DIAGNOSIS — S92351A Displaced fracture of fifth metatarsal bone, right foot, initial encounter for closed fracture: Secondary | ICD-10-CM | POA: Diagnosis not present

## 2016-10-31 DIAGNOSIS — Z7951 Long term (current) use of inhaled steroids: Secondary | ICD-10-CM | POA: Diagnosis not present

## 2016-10-31 DIAGNOSIS — Z91040 Latex allergy status: Secondary | ICD-10-CM | POA: Insufficient documentation

## 2016-10-31 DIAGNOSIS — J45909 Unspecified asthma, uncomplicated: Secondary | ICD-10-CM | POA: Insufficient documentation

## 2016-10-31 DIAGNOSIS — W109XXA Fall (on) (from) unspecified stairs and steps, initial encounter: Secondary | ICD-10-CM | POA: Insufficient documentation

## 2016-10-31 DIAGNOSIS — G43909 Migraine, unspecified, not intractable, without status migrainosus: Secondary | ICD-10-CM | POA: Diagnosis present

## 2016-10-31 DIAGNOSIS — D5700 Hb-SS disease with crisis, unspecified: Principal | ICD-10-CM | POA: Insufficient documentation

## 2016-10-31 DIAGNOSIS — Z79899 Other long term (current) drug therapy: Secondary | ICD-10-CM | POA: Diagnosis not present

## 2016-10-31 DIAGNOSIS — Z888 Allergy status to other drugs, medicaments and biological substances status: Secondary | ICD-10-CM | POA: Insufficient documentation

## 2016-10-31 HISTORY — DX: Gastro-esophageal reflux disease without esophagitis: K21.9

## 2016-10-31 HISTORY — DX: Headache: R51

## 2016-10-31 HISTORY — DX: Headache, unspecified: R51.9

## 2016-10-31 HISTORY — DX: Unspecified asthma, uncomplicated: J45.909

## 2016-10-31 LAB — BASIC METABOLIC PANEL
Anion gap: 4 — ABNORMAL LOW (ref 5–15)
BUN: 12 mg/dL (ref 6–20)
CALCIUM: 8.9 mg/dL (ref 8.9–10.3)
CO2: 28 mmol/L (ref 22–32)
CREATININE: 0.78 mg/dL (ref 0.61–1.24)
Chloride: 107 mmol/L (ref 101–111)
GFR calc non Af Amer: >60 mL/min (ref >60)
GLUCOSE: 108 mg/dL — AB (ref 65–99)
Potassium: 3.3 mmol/L — ABNORMAL LOW (ref 3.5–5.1)
Sodium: 139 mmol/L (ref 135–145)

## 2016-10-31 LAB — CBC WITH DIFFERENTIAL/PLATELET
BASOS PCT: 1 %
Basophils Absolute: 0.1 10*3/uL (ref 0.0–0.1)
EOS ABS: 0.4 10*3/uL (ref 0.0–0.7)
Eosinophils Relative: 3 %
HCT: 30.5 % — ABNORMAL LOW (ref 39.0–52.0)
Hemoglobin: 10.9 g/dL — ABNORMAL LOW (ref 13.0–17.0)
LYMPHS ABS: 4.9 10*3/uL — AB (ref 0.7–4.0)
Lymphocytes Relative: 34 %
MCH: 25 pg — AB (ref 26.0–34.0)
MCHC: 35.7 g/dL (ref 30.0–36.0)
MCV: 70 fL — ABNORMAL LOW (ref 78.0–100.0)
MONO ABS: 1.2 10*3/uL — AB (ref 0.1–1.0)
Monocytes Relative: 8 %
NEUTROS ABS: 7.8 10*3/uL — AB (ref 1.7–7.7)
Neutrophils Relative %: 54 %
PLATELETS: 442 10*3/uL — AB (ref 150–400)
RBC: 4.36 MIL/uL (ref 4.22–5.81)
RDW: 15.4 % (ref 11.5–15.5)
WBC: 14.4 10*3/uL — ABNORMAL HIGH (ref 4.0–10.5)

## 2016-10-31 LAB — LACTATE DEHYDROGENASE: LDH: 121 U/L (ref 98–192)

## 2016-10-31 MED ORDER — HYDROMORPHONE 1 MG/ML IV SOLN
INTRAVENOUS | Status: DC
  Administered 2016-10-31: 25 mg via INTRAVENOUS
  Administered 2016-10-31: 2.1 mg via INTRAVENOUS
  Administered 2016-10-31: 2 mg via INTRAVENOUS
  Administered 2016-10-31: 1.2 mg via INTRAVENOUS
  Filled 2016-10-31: qty 25

## 2016-10-31 MED ORDER — KETOROLAC TROMETHAMINE 15 MG/ML IJ SOLN
15.0000 mg | Freq: Four times a day (QID) | INTRAMUSCULAR | Status: AC
  Administered 2016-10-31 – 2016-11-01 (×5): 15 mg via INTRAVENOUS
  Filled 2016-10-31 (×5): qty 1

## 2016-10-31 MED ORDER — HYDROMORPHONE HCL 1 MG/ML IJ SOLN
1.0000 mg | Freq: Once | INTRAMUSCULAR | Status: AC
  Administered 2016-10-31: 1 mg via INTRAVENOUS
  Filled 2016-10-31: qty 1

## 2016-10-31 MED ORDER — FOLIC ACID 0.5 MG HALF TAB
500.0000 ug | ORAL_TABLET | Freq: Every day | ORAL | Status: DC
  Administered 2016-10-31 – 2016-11-01 (×2): 0.5 mg via ORAL
  Filled 2016-10-31 (×2): qty 1

## 2016-10-31 MED ORDER — NALOXONE HCL 0.4 MG/ML IJ SOLN: 0.4000 mg | INTRAMUSCULAR | Status: DC | PRN

## 2016-10-31 MED ORDER — PANTOPRAZOLE SODIUM 40 MG PO TBEC
40.0000 mg | DELAYED_RELEASE_TABLET | Freq: Every day | ORAL | Status: DC
  Administered 2016-10-31 – 2016-11-01 (×2): 40 mg via ORAL
  Filled 2016-10-31 (×2): qty 1

## 2016-10-31 MED ORDER — PROMETHAZINE HCL 25 MG PO TABS: 25.0000 mg | ORAL_TABLET | ORAL | Status: DC | PRN

## 2016-10-31 MED ORDER — ALBUTEROL SULFATE (2.5 MG/3ML) 0.083% IN NEBU: 2.5000 mg | INHALATION_SOLUTION | Freq: Four times a day (QID) | RESPIRATORY_TRACT | Status: DC | PRN

## 2016-10-31 MED ORDER — PROCHLORPERAZINE EDISYLATE 5 MG/ML IJ SOLN
10.0000 mg | Freq: Once | INTRAMUSCULAR | Status: AC
  Administered 2016-10-31: 10 mg via INTRAVENOUS
  Filled 2016-10-31: qty 2

## 2016-10-31 MED ORDER — GABAPENTIN 300 MG PO CAPS
600.0000 mg | ORAL_CAPSULE | Freq: Three times a day (TID) | ORAL | Status: DC
  Administered 2016-10-31 – 2016-11-01 (×4): 600 mg via ORAL
  Filled 2016-10-31 (×4): qty 2

## 2016-10-31 MED ORDER — ENOXAPARIN SODIUM 40 MG/0.4ML ~~LOC~~ SOLN
40.0000 mg | SUBCUTANEOUS | Status: DC
  Administered 2016-10-31 – 2016-11-01 (×2): 40 mg via SUBCUTANEOUS
  Filled 2016-10-31 (×2): qty 0.4

## 2016-10-31 MED ORDER — SODIUM CHLORIDE 0.9% FLUSH: 9.0000 mL | INTRAVENOUS | Status: DC | PRN

## 2016-10-31 MED ORDER — SUMATRIPTAN SUCCINATE 50 MG PO TABS
50.0000 mg | ORAL_TABLET | Freq: Once | ORAL | Status: AC
  Administered 2016-10-31: 50 mg via ORAL
  Filled 2016-10-31: qty 1

## 2016-10-31 MED ORDER — SODIUM CHLORIDE 0.45 % IV SOLN
INTRAVENOUS | Status: AC
  Administered 2016-10-31: 15:00:00 via INTRAVENOUS
  Administered 2016-10-31: 1000 mL via INTRAVENOUS
  Administered 2016-11-01: 02:00:00 via INTRAVENOUS

## 2016-10-31 MED ORDER — ALBUTEROL SULFATE HFA 108 (90 BASE) MCG/ACT IN AERS: 2.0000 | INHALATION_SPRAY | Freq: Four times a day (QID) | RESPIRATORY_TRACT | Status: DC | PRN

## 2016-10-31 MED ORDER — GABAPENTIN 600 MG PO TABS
600.0000 mg | ORAL_TABLET | Freq: Three times a day (TID) | ORAL | Status: DC
  Filled 2016-10-31: qty 1

## 2016-10-31 MED ORDER — POLYETHYLENE GLYCOL 3350 17 G PO PACK: 17.0000 g | PACK | Freq: Every day | ORAL | Status: DC | PRN

## 2016-10-31 MED ORDER — SENNOSIDES-DOCUSATE SODIUM 8.6-50 MG PO TABS
1.0000 | ORAL_TABLET | Freq: Two times a day (BID) | ORAL | Status: DC
  Administered 2016-10-31 – 2016-11-01 (×3): 1 via ORAL
  Filled 2016-10-31 (×3): qty 1

## 2016-10-31 MED ORDER — OXYCODONE HCL 5 MG PO TABS
30.0000 mg | ORAL_TABLET | Freq: Four times a day (QID) | ORAL | Status: DC | PRN
  Administered 2016-10-31 – 2016-11-01 (×3): 30 mg via ORAL
  Filled 2016-10-31 (×4): qty 6

## 2016-10-31 MED ORDER — MOMETASONE FURO-FORMOTEROL FUM 200-5 MCG/ACT IN AERO
2.0000 | INHALATION_SPRAY | Freq: Two times a day (BID) | RESPIRATORY_TRACT | Status: DC
  Administered 2016-10-31 – 2016-11-01 (×3): 2 via RESPIRATORY_TRACT
  Filled 2016-10-31: qty 8.8

## 2016-10-31 MED ORDER — DIPHENHYDRAMINE HCL 50 MG/ML IJ SOLN
25.0000 mg | Freq: Once | INTRAMUSCULAR | Status: AC
  Administered 2016-10-31: 25 mg via INTRAVENOUS
  Filled 2016-10-31: qty 1

## 2016-10-31 MED ORDER — ATOMOXETINE HCL 60 MG PO CAPS
100.0000 mg | ORAL_CAPSULE | Freq: Every day | ORAL | Status: DC
  Administered 2016-10-31 – 2016-11-01 (×2): 100 mg via ORAL
  Filled 2016-10-31 (×2): qty 1

## 2016-10-31 MED ORDER — TRAZODONE HCL 50 MG PO TABS: 150.0000 mg | ORAL_TABLET | Freq: Every evening | ORAL | Status: DC | PRN

## 2016-10-31 MED ORDER — ONDANSETRON HCL 4 MG/2ML IJ SOLN: 4.0000 mg | Freq: Four times a day (QID) | INTRAMUSCULAR | Status: DC | PRN
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
## 2016-10-31 NOTE — ED Notes (Signed)
Gave pt apple juice  

## 2016-10-31 NOTE — Progress Notes (Signed)
Paged for orders on arrival,paged again as pt is needing something for pain.wbb

## 2016-10-31 NOTE — ED Notes (Signed)
Gave pt sandwich tray.

## 2016-10-31 NOTE — Progress Notes (Signed)
[name], male   DOB: [birth year], 36 y.o.   MRN: [id number] Joel Leblanc is an opiate tolerant patient with Hb SS who was admitted last night for acute vaso-occlusive crisis. Pt is under the care of Dr. Blenda MountsSteve Prakken for pain management. According to patient he was recently started on Belbuca after having been on Oxycodone and Methadone. Pt reports that the Belbuca has resulted in mouth sores (Belbuca can cause mucositis) preventing him from being able to eat. He states that he had been on Oxycodone Immediate Release for several years on a regimen on 30 mg during the winter months and this could usually be decreased to 15 mg during the summer months. He has articulated that he has concerns about the anti-depressant medications that he has been taking.   Currently he reports that his pain is at 0/10 and his HA has resolved with the Imitrex. He has used only 5 mg since admission. I will discontinue the PCA and start on Oxycodone 30 mg . I have tried to reach Dr. Renold GentaPrakken but was unsuccessful as he has a narcotic contract and will need to coordinate pain medications at the time of discharge which will likely be tomorrow.   Joel Leblanc A.

## 2016-10-31 NOTE — ED Notes (Signed)
Verbal consent done.

## 2016-11-01 DIAGNOSIS — G43009 Migraine without aura, not intractable, without status migrainosus: Secondary | ICD-10-CM

## 2016-11-01 DIAGNOSIS — D5700 Hb-SS disease with crisis, unspecified: Secondary | ICD-10-CM | POA: Diagnosis not present

## 2016-11-01 DIAGNOSIS — D57219 Sickle-cell/Hb-C disease with crisis, unspecified: Secondary | ICD-10-CM | POA: Diagnosis not present

## 2016-11-01 DIAGNOSIS — D5720 Sickle-cell/Hb-C disease without crisis: Secondary | ICD-10-CM | POA: Diagnosis present

## 2016-11-01 LAB — HIV ANTIBODY (ROUTINE TESTING W REFLEX): HIV Screen 4th Generation wRfx: NONREACTIVE

## 2016-11-01 MED ORDER — OXYCODONE HCL 15 MG PO TABS: 15.0000 mg | ORAL_TABLET | ORAL | 0 refills | Status: DC | PRN

## 2016-11-01 NOTE — Care Management CC44 (Signed)
Condition Code 44 Documentation Completed  Patient Details  Name: Joel Leblanc MRN: [id number] Date of Birth: 11-30-1980   Condition Code 44 given:  Yes Patient signature on Condition Code 44 notice:  Yes Documentation of 2 MD's agreement:  Yes Code 44 added to claim:  Yes    Bartholome BillCLEMENTS, [name], RN 11/01/2016, 12:53 PM

## 2016-11-01 NOTE — Progress Notes (Signed)
Pt discharged home in stable condition. Discharge instructions and script given. Pt verbalized understanding. No immediate questions or concerns at this time.  

## 2016-11-01 NOTE — Care Management Obs Status (Signed)
MEDICARE OBSERVATION STATUS NOTIFICATION   Patient Details  Name: Joel Leblanc MRN: [id number] Date of Birth: 1981/03/19   Medicare Observation Status Notification Given:  Yes    Bartholome BillCLEMENTS, Lealer Marsland H, RN 11/01/2016, 12:53 PM

## 2016-11-01 NOTE — Discharge Summary (Signed)
Joel Leblanc MRN: [id number] DOB/AGE: 37/02/1981 37 y.o.  Admit date: 10/31/2016 Discharge date: 11/01/2016  Primary Care Physician:  [facility]   Discharge Diagnoses:   Patient Active Problem List   Diagnosis Date Noted  . Sickle cell pain crisis (HCC) 10/31/2016  . Asthma 10/31/2016    DISCHARGE MEDICATION: Allergies as of 11/01/2016      Reactions   Latex Rash   Lyrica [pregabalin] Rash   Morphine And Related Nausea And Vomiting, Rash      Medication List    TAKE these medications   albuterol 108 (90 Base) MCG/ACT inhaler Commonly known as:  PROVENTIL HFA;VENTOLIN HFA Inhale into the lungs every 6 (six) hours as needed for wheezing or shortness of breath.   atomoxetine 100 MG capsule Commonly known as:  STRATTERA Take 100 mg by mouth daily.   budesonide-formoterol 160-4.5 MCG/ACT inhaler Commonly known as:  SYMBICORT Inhale 2 puffs into the lungs 2 (two) times daily.   DEXILANT PO Take 30 mg by mouth.   folic acid 800 MCG tablet Commonly known as:  FOLVITE Take 400 mcg by mouth daily.   gabapentin 600 MG tablet Commonly known as:  NEURONTIN Take 600 mg by mouth 3 (three) times daily.   ibuprofen 200 MG tablet Commonly known as:  ADVIL,MOTRIN Take 600 mg by mouth every 6 (six) hours as needed for moderate pain.   oxyCODONE 15 MG immediate release tablet Commonly known as:  ROXICODONE Take 1 tablet (15 mg total) by mouth every 4 (four) hours as needed for pain.   promethazine 25 MG tablet Commonly known as:  PHENERGAN Take 25 mg by mouth as needed for nausea or vomiting.   SUMAtriptan 100 MG tablet Commonly known as:  IMITREX Take 1 tablet by mouth as needed for migraine.   traZODone 150 MG tablet Commonly known as:  DESYREL Take 150 mg by mouth at bedtime as needed for sleep.   zolpidem 10 MG tablet Commonly known as:  AMBIEN Take 10 mg by mouth at bedtime as needed for sleep.         Consults:    SIGNIFICANT  DIAGNOSTIC STUDIES:  Dg Foot Complete Right  Result Date: 10/05/2016 CLINICAL DATA:  Patient fell down steps and twisted foot. History of sickle cell. EXAM: RIGHT FOOT COMPLETE - 3+ VIEW COMPARISON:  None. FINDINGS: Acute, closed fifth metatarsal neck fracture without intra-articular involvement. No significant displacement. Mild dorsal angulation of the distal fracture fragment suggested on the lateral projection. IMPRESSION: Acute, closed fracture of the right fifth metatarsal neck with slight dorsal angulation of the distal fracture fragment. Electronically Signed   By: Tollie Eth M.D.   On: 10/05/2016 19:25       No results found for this or any previous visit (from the past 240 hour(s)).  BRIEF ADMITTING H & P: Joel Leblanc is a 36 y.o. male with history of sickle cell anemia, asthma, migraine presents to the ER at [facility] with complaints of joint pains of both upper and lower extremities for the last 1 week. Patient states he takes oxycodone but he ran out off it last 1 week since patient only had less than usual refills. Denies any chest pain or any shortness of breath productive cough fever or chills. Has been having some headache which patient states is typical of his migraine.   ED Course: Patient was given pain relief medication in the ER despite which patient was still having pain and has  been admitted for sickle cell pain crisis. On exam patient is not in distress and not hypoxic. Denies any chest pain or shortness of breath. Patient is not having any focal deficits.   Hospital Course:  Present on Admission: . (Resolved) Migraine . Asthma . (Resolved) Hb-[place] disease with crisis [facility](HCC)  This is a patient with Hb [place] who was admitted with sickle cell crisis. He was managed with IV Dilaudid via PCA and Toradol. He had minimal use on the PCA and was transitioned to Oxycodone. I was surprised at how quickly he transitioned to oral analgesics as this is  more consistent with a withdrawal in a Opiate tolerant patient. However his pain was well controlled on the Oxycodone and except for a migraine HA which is now resolved, he was pain free at the time of discharge. I spoke with Dr. Renold GentaPrakken (Pain specialist)  this morning with the patient's permission and Dr. Renold GentaPrakken advised that the patient was [facility] more than one [facility] and as such the patient will not be able to receive a prescription for a full agonist. The patient has been prescribed a partial agonist ([place]) and he did not initially   Disclose this as one of his medications. However the patient reports that although he was prescribed [place], he was not taking it as it caused sores in his mouth. I have advised patient to make a follow up appointment with Dr. Renold GentaPrakken and discuss other options for long term management of his pain. However as management for his acute condition, I have written a prescription (with Dr. Billey CoPrakkens knowledge) fro Oxycodone 15 mg #20 tabs to be used q4 hours PRN.   Pt also had a migraine HA during hospitalization and he reports that thi sis usually treated  With Imitrex. Imitrex was prescribed and HA was aborted.   Other chronic medications were continued without interruption.   Disposition and Follow-up: Pt discharged home in good condition and is to follow up with Pain clinic in next week.   Discharge Instructions    Activity as tolerated - No restrictions    Complete by:  As directed    Diet general    Complete by:  As directed       DISCHARGE EXAM:  General: Alert, awake, oriented x3, in no apparent distress. Well appearing HEENT: [place]/AT PEERL, EOMI, anicteric Neck: Trachea midline, no masses, no thyromegal,y no JVD, no carotid bruit OROPHARYNX: Moist, No exudate/ erythema/lesions.  Heart: Regular rate and rhythm, without murmurs, rubs, gallops or S3. PMI non-displaced. Exam reveals no decreased pulses. Pulmonary/Chest: Normal effort. Breath  sounds normal. No. Apnea. Clear to auscultation,no stridor,  no wheezing and no rhonchi noted. No respiratory distress and no tenderness noted. Abdomen: Soft, nontender, nondistended, normal bowel sounds, no masses no hepatosplenomegaly noted. No fluid wave and no ascites. There is no guarding or rebound. Neuro: Alert and oriented to person, place and time. Normal motor skills, Displays no atrophy or tremors and exhibits normal muscle tone.  No focal neurological deficits noted cranial nerves II through XII grossly intact. No sensory deficit noted. Strength at baseline in bilateral upper and lower extremities. Gait normal. Musculoskeletal: No warmth swelling or erythema around joints, no spinal tenderness noted. Psychiatric: Patient alert and oriented x3, good insight and cognition. Mood, memory, affect and judgement normal. Skin: Skin is warm and dry. No bruising, no ecchymosis and no rash noted. Pt is not diaphoretic. No erythema. No pallor      Blood pressure (!) 152/86,  pulse 94, temperature 98.1 F (36.7 C), temperature source Oral, resp. rate 17, height 6' (1.829 m), weight 99.4 kg (219 lb 2.2 oz), SpO2 98 %.   Recent Labs  10/30/16 1955 10/31/16 1026  NA 137 139  K 3.5 3.3*  CL 105 107  CO2 26 28  GLUCOSE 112* 108*  BUN 12 12  CREATININE 0.89 0.78  CALCIUM 9.3 8.9    Recent Labs  10/30/16 1955  AST 26  ALT 21  ALKPHOS 68  BILITOT 1.0  PROT 7.9  ALBUMIN 4.2   No results for input(s): LIPASE, AMYLASE in the last 72 hours.  Recent Labs  10/30/16 1955 10/31/16 1026  WBC 19.1* 14.4*  NEUTROABS 12.0* 7.8*  HGB 12.9* 10.9*  HCT 37.5* 30.5*  MCV 72.6* 70.0*  PLT 519* 442*     Total time spent including face to face and decision making was greater than 30 minutes  Signed: MATTHEWS,MICHELLE A. 11/01/2016, 12:38 PM

## 2017-09-28 ENCOUNTER — Non-Acute Institutional Stay (HOSPITAL_COMMUNITY)
Admission: AD | Admit: 2017-09-28 | Discharge: 2017-09-28 | Disposition: A | Discharge status: Home / Self Care | Payer: Medicare Other | Source: Ambulatory Visit | Attending: Internal Medicine | Admitting: Internal Medicine

## 2017-09-28 DIAGNOSIS — Z91040 Latex allergy status: Secondary | ICD-10-CM | POA: Diagnosis not present

## 2017-09-28 DIAGNOSIS — K219 Gastro-esophageal reflux disease without esophagitis: Secondary | ICD-10-CM | POA: Diagnosis not present

## 2017-09-28 DIAGNOSIS — D5700 Hb-SS disease with crisis, unspecified: Secondary | ICD-10-CM | POA: Diagnosis not present

## 2017-09-28 DIAGNOSIS — Z79891 Long term (current) use of opiate analgesic: Secondary | ICD-10-CM | POA: Diagnosis not present

## 2017-09-28 DIAGNOSIS — Z885 Allergy status to narcotic agent status: Secondary | ICD-10-CM | POA: Diagnosis not present

## 2017-09-28 DIAGNOSIS — J45909 Unspecified asthma, uncomplicated: Secondary | ICD-10-CM | POA: Insufficient documentation

## 2017-09-28 DIAGNOSIS — M79605 Pain in left leg: Secondary | ICD-10-CM | POA: Diagnosis present

## 2017-09-28 DIAGNOSIS — Z7951 Long term (current) use of inhaled steroids: Secondary | ICD-10-CM | POA: Diagnosis not present

## 2017-09-28 DIAGNOSIS — Z79899 Other long term (current) drug therapy: Secondary | ICD-10-CM | POA: Insufficient documentation

## 2017-09-28 DIAGNOSIS — M79604 Pain in right leg: Secondary | ICD-10-CM | POA: Diagnosis present

## 2017-09-28 DIAGNOSIS — Z888 Allergy status to other drugs, medicaments and biological substances status: Secondary | ICD-10-CM | POA: Diagnosis not present

## 2017-09-28 DIAGNOSIS — M545 Low back pain: Secondary | ICD-10-CM | POA: Diagnosis present

## 2017-09-28 LAB — CBC WITH DIFFERENTIAL/PLATELET
Basophils Absolute: 0.1 10*3/uL (ref 0.0–0.1)
Basophils Relative: 1 %
EOS ABS: 0.3 10*3/uL (ref 0.0–0.7)
Eosinophils Relative: 2 %
HEMATOCRIT: 33.8 % — AB (ref 39.0–52.0)
HEMOGLOBIN: 11.9 g/dL — AB (ref 13.0–17.0)
LYMPHS ABS: 3.4 10*3/uL (ref 0.7–4.0)
Lymphocytes Relative: 30 %
MCH: 25.7 pg — AB (ref 26.0–34.0)
MCHC: 35.2 g/dL (ref 30.0–36.0)
MCV: 73 fL — ABNORMAL LOW (ref 78.0–100.0)
Monocytes Absolute: 0.9 10*3/uL (ref 0.1–1.0)
Monocytes Relative: 8 %
NEUTROS ABS: 6.5 10*3/uL (ref 1.7–7.7)
NEUTROS PCT: 59 %
Platelets: 414 10*3/uL — ABNORMAL HIGH (ref 150–400)
RBC: 4.63 MIL/uL (ref 4.22–5.81)
RDW: 15.7 % — ABNORMAL HIGH (ref 11.5–15.5)
WBC: 11.2 10*3/uL — AB (ref 4.0–10.5)

## 2017-09-28 LAB — RETICULOCYTES
RBC.: 4.63 MIL/uL (ref 4.22–5.81)
Retic Count, Absolute: 185.2 10*3/uL (ref 19.0–186.0)
Retic Ct Pct: 4 % — ABNORMAL HIGH (ref 0.4–3.1)

## 2017-09-28 LAB — COMPREHENSIVE METABOLIC PANEL
ALBUMIN: 4 g/dL (ref 3.5–5.0)
ALK PHOS: 70 U/L (ref 38–126)
ALT: 30 U/L (ref 17–63)
AST: 33 U/L (ref 15–41)
Anion gap: 9 (ref 5–15)
BILIRUBIN TOTAL: 1 mg/dL (ref 0.3–1.2)
BUN: 12 mg/dL (ref 6–20)
CALCIUM: 9.2 mg/dL (ref 8.9–10.3)
CO2: 26 mmol/L (ref 22–32)
CREATININE: 0.85 mg/dL (ref 0.61–1.24)
Chloride: 106 mmol/L (ref 101–111)
GFR calc non Af Amer: >60 mL/min (ref >60)
GLUCOSE: 109 mg/dL — AB (ref 65–99)
Potassium: 3.8 mmol/L (ref 3.5–5.1)
SODIUM: 141 mmol/L (ref 135–145)
Total Protein: 8.2 g/dL — ABNORMAL HIGH (ref 6.5–8.1)

## 2017-09-28 MED ORDER — KETOROLAC TROMETHAMINE 30 MG/ML IJ SOLN
15.0000 mg | Freq: Once | INTRAMUSCULAR | Status: AC
  Administered 2017-09-28: 15 mg via INTRAVENOUS
  Filled 2017-09-28: qty 1

## 2017-09-28 MED ORDER — ONDANSETRON HCL 4 MG/2ML IJ SOLN
4.0000 mg | Freq: Four times a day (QID) | INTRAMUSCULAR | Status: DC | PRN
  Administered 2017-09-28: 4 mg via INTRAVENOUS
  Filled 2017-09-28: qty 2

## 2017-09-28 MED ORDER — DEXTROSE-NACL 5-0.45 % IV SOLN
INTRAVENOUS | Status: DC
  Administered 2017-09-28: 12:00:00 via INTRAVENOUS

## 2017-09-28 MED ORDER — NALOXONE HCL 0.4 MG/ML IJ SOLN: 0.4000 mg | INTRAMUSCULAR | Status: DC | PRN

## 2017-09-28 MED ORDER — DIPHENHYDRAMINE HCL 25 MG PO CAPS
25.0000 mg | ORAL_CAPSULE | ORAL | Status: DC | PRN
  Filled 2017-09-28: qty 1

## 2017-09-28 MED ORDER — SODIUM CHLORIDE 0.9 % IV SOLN
25.0000 mg | INTRAVENOUS | Status: DC | PRN
  Filled 2017-09-28: qty 0.5

## 2017-09-28 MED ORDER — HYDROMORPHONE 1 MG/ML IV SOLN
INTRAVENOUS | Status: DC
  Administered 2017-09-28: 12:00:00 via INTRAVENOUS
  Administered 2017-09-28: 6 mg via INTRAVENOUS
  Filled 2017-09-28: qty 25

## 2017-09-28 MED ORDER — SODIUM CHLORIDE 0.9% FLUSH: 9.0000 mL | INTRAVENOUS | Status: DC | PRN
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
## 2017-09-28 NOTE — Discharge Instructions (Signed)
Sickle Cell Anemia, Adult °Sickle cell anemia is a condition where your red blood cells are shaped like sickles. Red blood cells carry oxygen through the body. Sickle-shaped red blood cells do not live as long as normal red blood cells. They also clump together and block blood from flowing through the blood vessels. These things prevent the body from getting enough oxygen. Sickle cell anemia causes organ damage and pain. It also increases the risk of infection. °Follow these instructions at home: °· Drink enough fluid to keep your pee (urine) clear or pale yellow. Drink more in hot weather and during exercise. °· Do not smoke. Smoking lowers oxygen levels in the blood. °· Only take over-the-counter or prescription medicines as told by your doctor. °· Take antibiotic medicines as told by your doctor. Make sure you finish them even if you start to feel better. °· Take supplements as told by your doctor. °· Consider wearing a medical alert bracelet. This tells anyone caring for you in an emergency of your condition. °· When traveling, keep your medical information, doctors' names, and the medicines you take with you at all times. °· If you have a fever, do not take fever medicines right away. This could cover up a problem. Tell your doctor. °· Keep all follow-up visits with your doctor. Sickle cell anemia requires regular medical care. °Contact a doctor if: °You have a fever. °Get help right away if: °· You feel dizzy or faint. °· You have new belly (abdominal) pain, especially on the left side near the stomach area. °· You have a lasting, often uncomfortable and painful erection of the penis (priapism). If it is not treated right away, you will become unable to have sex (impotence). °· You have numbness in your arms or legs or you have a hard time moving them. °· You have a hard time talking. °· You have a fever or lasting symptoms for more than 2-3 days. °· You have a fever and your symptoms suddenly get  worse. °· You have signs or symptoms of infection. These include: °? Chills. °? Being more tired than normal (lethargy). °? Irritability. °? Poor eating. °? Throwing up (vomiting). °· You have pain that is not helped with medicine. °· You have shortness of breath. °· You have pain in your chest. °· You are coughing up pus-like or bloody mucus. °· You have a stiff neck. °· Your feet or hands swell or have pain. °· Your belly looks bloated. °· Your joints hurt. °This information is not intended to replace advice given to you by your health care provider. Make sure you discuss any questions you have with your health care provider. °Document Released: 02/12/2013 Document Revised: 09/30/2015 Document Reviewed: 12/04/2012 °Elsevier Interactive Patient Education © 2017 [employer]. ° °

## 2017-09-28 NOTE — Progress Notes (Signed)
Pt arrived in lobby of patient care center and states experiencing pain in right hand and right thigh related to sickle cell disease; states took home pain medicaions of oxycodone and methadone last night; denies chest pain, shortness of breath, vomiting, diarrhea, or priapism; NP notified

## 2017-09-28 NOTE — Discharge Summary (Signed)
[facility] Discharge Summary   [name] MRN: [id number] DOB/AGE: July 24, 1980 37 y.o.  Admit date: 09/28/2017 Discharge date: 09/28/2017  Primary Care Physician:  [facility]  Admission Diagnoses:  Active Problems:   Sickle cell crisis [facility])   Discharge Medications:  Allergies as of 09/28/2017      Reactions   Hydroxyurea Hives   Latex Rash   Lyrica [pregabalin] Rash   Morphine And Related Nausea And Vomiting, Rash      Medication List    TAKE these medications   albuterol 108 (90 Base) MCG/ACT inhaler Commonly known as:  PROVENTIL HFA;VENTOLIN HFA Inhale into the lungs every 6 (six) hours as needed for wheezing or shortness of breath.   atomoxetine 100 MG capsule Commonly known as:  STRATTERA Take 100 mg by mouth daily.   budesonide-formoterol 160-4.5 MCG/ACT inhaler Commonly known as:  SYMBICORT Inhale 2 puffs into the lungs 2 (two) times daily.   DEXILANT PO Take 30 mg by mouth.   folic acid 800 MCG tablet Commonly known as:  FOLVITE Take 400 mcg by mouth daily.   gabapentin 600 MG tablet Commonly known as:  NEURONTIN Take 600 mg by mouth 3 (three) times daily.   ibuprofen 200 MG tablet Commonly known as:  ADVIL,MOTRIN Take 600 mg by mouth every 6 (six) hours as needed for moderate pain.   oxyCODONE 15 MG immediate release tablet Commonly known as:  ROXICODONE Take 1 tablet (15 mg total) by mouth every 4 (four) hours as needed for pain.   promethazine 25 MG tablet Commonly known as:  PHENERGAN Take 25 mg by mouth as needed for nausea or vomiting.   SUMAtriptan 100 MG tablet Commonly known as:  IMITREX Take 1 tablet by mouth as needed for migraine.   traZODone 150 MG tablet Commonly known as:  DESYREL Take 150 mg by mouth at bedtime as needed for sleep.   zolpidem 10 MG tablet Commonly known as:  AMBIEN Take 10 mg by mouth at bedtime as needed for sleep.        Consults:   None  Significant Diagnostic Studies:  No results found.   [facility] Course: 37 year old opiate tolerant male with a history of sickle cell anemia hemoglobin [place] was admitted to day infusion center for pain management and extended observation.  Patient is opiate tolerant. Hypotonic IV fluids started for cellular rehydration Toradol 15 mg IV for inflammation High concentrated Dilaudid PCA per weight-based protocol initiated.  Patient used a total of 6 mg with 14 demands and 12 deliveries.  Pain intensity decreased to 3-4/10. Patient alert, oriented, and ambulating. We will discharge home with family in stable condition  Discharge instructions: Resume all home medications Increase water intake to 64 ounces per day Limit exposures to stressors that precipitated sickle cell crisis.  The patient was given clear instructions to go to ER or return to medical center if symptoms do not improve, worsen or new problems develop. The patient verbalized understanding.    Physical Exam at Discharge:  BP (!) 142/95 (BP Location: Right Arm)   Pulse (!) 103   Temp 98.8 F (37.1 C) (Oral)   Resp (!) 21   SpO2 100%  Physical Exam  Constitutional: He is oriented to person, place, and time.  HENT:  Head: Normocephalic.  Cardiovascular: Normal rate, regular rhythm and normal heart sounds.  Pulmonary/Chest: Effort normal and breath sounds normal.  Abdominal: Soft. Bowel sounds are normal.  Neurological:  He is alert and oriented to person, place, and time.  Skin: Skin is warm and dry.  Psychiatric: He has a normal mood and affect. His behavior is normal. Judgment and thought content normal.    Disposition at Discharge:   Discharge Orders:   Condition at Discharge:   Stable  Time spent on Discharge:  Greater than 30 minutes.  Signed: Julianne Handler 09/28/2017, 2:08 PM

## 2017-09-28 NOTE — Progress Notes (Signed)
Pt discharged to home; pt alert, oriented, and ambulatory; states that his pain is tolerable right now; requests to leave to care for his son; discharge instructions explained, given, and signed; all questions answered; no complications noted

## 2018-11-19 ENCOUNTER — Encounter: Payer: Self-pay | Admitting: Family Medicine

## 2018-11-19 ENCOUNTER — Other Ambulatory Visit: Payer: Self-pay

## 2018-11-19 ENCOUNTER — Ambulatory Visit (INDEPENDENT_AMBULATORY_CARE_PROVIDER_SITE_OTHER): Payer: Medicare Other | Admitting: Family Medicine

## 2018-11-19 VITALS — BP 142/79 | HR 82 | Temp 98.7°F | Resp 16 | Ht 72.0 in | Wt 246.0 lb

## 2018-11-19 DIAGNOSIS — D5720 Sickle-cell/Hb-C disease without crisis: Secondary | ICD-10-CM | POA: Diagnosis not present

## 2018-11-19 DIAGNOSIS — J351 Hypertrophy of tonsils: Secondary | ICD-10-CM | POA: Diagnosis not present

## 2018-11-19 DIAGNOSIS — H9201 Otalgia, right ear: Secondary | ICD-10-CM

## 2018-11-19 DIAGNOSIS — F331 Major depressive disorder, recurrent, moderate: Secondary | ICD-10-CM | POA: Diagnosis not present

## 2018-11-19 DIAGNOSIS — E559 Vitamin D deficiency, unspecified: Secondary | ICD-10-CM | POA: Diagnosis not present

## 2018-11-19 DIAGNOSIS — R635 Abnormal weight gain: Secondary | ICD-10-CM

## 2018-11-19 DIAGNOSIS — F988 Other specified behavioral and emotional disorders with onset usually occurring in childhood and adolescence: Secondary | ICD-10-CM

## 2018-11-19 DIAGNOSIS — Z23 Encounter for immunization: Secondary | ICD-10-CM | POA: Diagnosis not present

## 2018-11-19 DIAGNOSIS — Z0000 Encounter for general adult medical examination without abnormal findings: Secondary | ICD-10-CM

## 2018-11-19 DIAGNOSIS — G894 Chronic pain syndrome: Secondary | ICD-10-CM

## 2018-11-19 LAB — POCT URINALYSIS DIPSTICK
Bilirubin, UA: NEGATIVE
Blood, UA: NEGATIVE
Glucose, UA: NEGATIVE
Ketones, UA: NEGATIVE
Leukocytes, UA: NEGATIVE
Nitrite, UA: NEGATIVE
Protein, UA: NEGATIVE
Spec Grav, UA: 1.02 (ref 1.010–1.025)
Urobilinogen, UA: 1 E.U./dL
pH, UA: 7 (ref 5.0–8.0)
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
## 2018-11-19 NOTE — Progress Notes (Signed)
Reason for Referral: Joel Leblanc is a 38 y.o. male  Pt was referred by [place] Hollis, NP for: multiple social issues   Pt reports the following concerns: no family in the area, trying to move from [place] to [employer].  Plan: 1. Addressed today: CSW introduced self and role at the [facility]. Provided lists of [employer], including housing. Advised that this CSW and [facility] and [facility] available for support. Patient familiar with [facility] but does not follow regularly with a counselor there. Advised that CSW available for additional support as needed in regards to housing transportation, food access, and supportive counseling.   2. Referral: multiple community resources [employer], [employer], [employer], [employer])  3. Follow up: As needed in clinic

## 2018-11-19 NOTE — Patient Instructions (Signed)
Follow up in 1 month.  Continue to hydrate with 64 ounces of fluid.   Discussed the importance of drinking 64 ounces of water daily. The Importance of Water. To help prevent pain crises, it is important to drink plenty of water throughout the day. This is because dehydration of red blood cells may lead to the sickling process.    You have been referred to ophthalmology, psychiatry, ENT, and hematology.     Sickle Cell Anemia, Adult  Sickle cell anemia is a condition where your red blood cells are shaped like sickles. Red blood cells carry oxygen through the body. Sickle-shaped cells do not live as long as normal red blood cells. They also clump together and block blood from flowing through the blood vessels. This prevents the body from getting enough oxygen. Sickle cell anemia causes organ damage and pain. It also increases the risk of infection. Follow these instructions at home: Medicines  Take over-the-counter and prescription medicines only as told by your doctor.  If you were prescribed an antibiotic medicine, take it as told by your doctor. Do not stop taking the antibiotic even if you start to feel better.  If you develop a fever, do not take medicines to lower the fever right away. Tell your doctor about the fever. Managing pain, stiffness, and swelling  Try these methods to help with pain: ? Use a heating pad. ? Take a warm bath. ? Distract yourself, such as by watching TV. Eating and drinking  Drink enough fluid to keep your pee (urine) clear or pale yellow. Drink more in hot weather and during exercise.  Limit or avoid alcohol.  Eat a healthy diet. Eat plenty of fruits, vegetables, whole grains, and lean protein.  Take vitamins and supplements as told by your doctor. Traveling  When traveling, keep these with you: ? Your medical information. ? The names of your doctors. ? Your medicines.  If you need to take an airplane, talk to your doctor first. Activity   Rest often.  Avoid exercises that make your heart beat much faster, such as jogging. General instructions  Do not use products that have nicotine or tobacco, such as cigarettes and e-cigarettes. If you need help quitting, ask your doctor.  Consider wearing a medical alert bracelet.  Avoid being in high places (high altitudes), such as mountains.  Avoid very hot or cold temperatures.  Avoid places where the temperature changes a lot.  Keep all follow-up visits as told by your doctor. This is important. Contact a doctor if:  A joint hurts.  Your feet or hands hurt or swell.  You feel tired (fatigued). Get help right away if:  You have symptoms of infection. These include: ? Fever. ? Chills. ? Being very tired. ? Irritability. ? Poor eating. ? Throwing up (vomiting).  You feel dizzy or faint.  You have new stomach pain, especially on the left side.  You have a an erection (priapism) that lasts more than 4 hours.  You have numbness in your arms or legs.  You have a hard time moving your arms or legs.  You have trouble talking.  You have pain that does not go away when you take medicine.  You are short of breath.  You are breathing fast.  You have a long-term cough.  You have pain in your chest.  You have a bad headache.  You have a stiff neck.  Your stomach looks bloated even though you did not eat much.  Your skin  is pale.  You suddenly cannot see well. Summary  Sickle cell anemia is a condition where your red blood cells are shaped like sickles.  Follow your doctor's advice on ways to manage pain, food to eat, activities to do, and steps to take for safe travel.  Get medical help right away if you have any signs of infection, such as a fever. This information is not intended to replace advice given to you by your health care provider. Make sure you discuss any questions you have with your health care provider. Document Released: 02/12/2013  Document Revised: 08/16/2018 Document Reviewed: 05/30/2016 Elsevier Patient Education  2020 [employer].

## 2018-11-20 LAB — COMPREHENSIVE METABOLIC PANEL
ALT: 40 IU/L (ref 0–44)
AST: 43 IU/L — ABNORMAL HIGH (ref 0–40)
Albumin/Globulin Ratio: 1.4 (ref 1.2–2.2)
Albumin: 4.2 g/dL (ref 4.0–5.0)
Alkaline Phosphatase: 73 IU/L (ref 39–117)
BUN/Creatinine Ratio: 10 (ref 9–20)
BUN: 8 mg/dL (ref 6–20)
Bilirubin Total: 0.9 mg/dL (ref 0.0–1.2)
CO2: 23 mmol/L (ref 20–29)
Calcium: 9.6 mg/dL (ref 8.7–10.2)
Chloride: 106 mmol/L (ref 96–106)
Creatinine, Ser: 0.81 mg/dL (ref 0.76–1.27)
GFR calc Af Amer: 129 mL/min/{1.73_m2} (ref >59)
GFR calc non Af Amer: 112 mL/min/{1.73_m2} (ref >59)
Globulin, Total: 3 g/dL (ref 1.5–4.5)
Glucose: 104 mg/dL — ABNORMAL HIGH (ref 65–99)
Potassium: 4 mmol/L (ref 3.5–5.2)
Sodium: 140 mmol/L (ref 134–144)
Total Protein: 7.2 g/dL (ref 6.0–8.5)

## 2018-11-20 LAB — CBC WITH DIFFERENTIAL/PLATELET
Basophils Absolute: 0.1 10*3/uL (ref 0.0–0.2)
Basos: 1 %
EOS (ABSOLUTE): 0.2 10*3/uL (ref 0.0–0.4)
Eos: 2 %
Hematocrit: 35.7 % — ABNORMAL LOW (ref 37.5–51.0)
Hemoglobin: 11.6 g/dL — ABNORMAL LOW (ref 13.0–17.7)
Immature Grans (Abs): 0 10*3/uL (ref 0.0–0.1)
Immature Granulocytes: 0 %
Lymphocytes Absolute: 3.2 10*3/uL — ABNORMAL HIGH (ref 0.7–3.1)
Lymphs: 28 %
MCH: 25.1 pg — ABNORMAL LOW (ref 26.6–33.0)
MCHC: 32.5 g/dL (ref 31.5–35.7)
MCV: 77 fL — ABNORMAL LOW (ref 79–97)
Monocytes Absolute: 1 10*3/uL — ABNORMAL HIGH (ref 0.1–0.9)
Monocytes: 8 %
NRBC: 1 % — ABNORMAL HIGH (ref 0–0)
Neutrophils Absolute: 6.8 10*3/uL (ref 1.4–7.0)
Neutrophils: 61 %
Platelets: 415 10*3/uL (ref 150–450)
RBC: 4.62 x10E6/uL (ref 4.14–5.80)
RDW: 17.2 % — ABNORMAL HIGH (ref 11.6–15.4)
WBC: 11.4 10*3/uL — ABNORMAL HIGH (ref 3.4–10.8)

## 2018-11-20 LAB — FERRITIN: Ferritin: 296 ng/mL (ref 30–400)

## 2018-11-20 LAB — RETICULOCYTES: Retic Ct Pct: 4.8 % — ABNORMAL HIGH (ref 0.6–2.6)

## 2018-11-20 LAB — VITAMIN D 25 HYDROXY (VIT D DEFICIENCY, FRACTURES): Vit D, 25-Hydroxy: 18.3 ng/mL — ABNORMAL LOW (ref 30.0–100.0)

## 2018-11-20 LAB — TSH: TSH: 1.09 u[IU]/mL (ref 0.450–4.500)

## 2018-11-22 ENCOUNTER — Other Ambulatory Visit: Payer: Self-pay | Admitting: Family Medicine

## 2018-11-22 ENCOUNTER — Telehealth: Payer: Self-pay

## 2018-11-22 DIAGNOSIS — E559 Vitamin D deficiency, unspecified: Secondary | ICD-10-CM

## 2018-11-22 MED ORDER — ERGOCALCIFEROL 1.25 MG (50000 UT) PO CAPS: 50000.0000 [IU] | ORAL_CAPSULE | ORAL | 2 refills | Status: DC

## 2018-11-22 NOTE — Telephone Encounter (Signed)
Called and spoke with patient. Advised that labs are consistent with baseline and that vitamin D levels were decreased. Advised that he should take vitamin D 50,000 units once weekly as directed and we will recheck in 12 weeks. Advised that he will need to follow up with pain management as we will not be able to prescribe opioids at this time due to his current contract with them. Advised that he should receive a call for other referrals with specialists in the next couple of weeks. Thanks !

## 2018-11-22 NOTE — Progress Notes (Signed)
Meds ordered this encounter  ?Medications  ? ergocalciferol (VITAMIN D2) 1.25 MG (50000 UT) capsule  ?  Sig: Take 1 capsule (50,000 Units total) by mouth once a week.  ?  Dispense:  12 capsule  ?  Refill:  2  ?  Order Specific Question:   Supervising Provider  ?  Answer:   JEGEDE, OLUGBEMIGA E [[id number]]  ? Joel Downard Moore Deztiny Sarra  APRN, MSN, FNP-C ?[facility] ?[facility] ?[address]  ?[place], [place] [address] ?[phone] ? ?

## 2018-11-22 NOTE — Telephone Encounter (Signed)
-----   Message from Dorena Dew, [place] sent at 11/22/2018  7:11 AM EDT ----- Regarding: lab results Please inform patient that labs are mostly consistent with his baseline. Inform patient that vitamin D level is decreased, which is consistent with vitamin d deficiency. Will start drisdol 50,000 IU weekly. Will recheck level in 12 weeks. Please continue to follow up with pain management specialist for refills. I will be unable to prescribe opiates at this time. He has a contract in place with pain management. Follow up as scheduled. Also, he has a number of pending referrals. Please let him know that the specialist will contact him for appointments.    Donia Pounds  APRN, MSN, FNP-C Patient [place] [address] [place], [place] [address] [phone]

## 2018-12-17 ENCOUNTER — Ambulatory Visit: Payer: Medicare Other | Admitting: Family Medicine

## 2018-12-24 ENCOUNTER — Ambulatory Visit: Payer: Medicare Other | Admitting: Family Medicine

## 2019-01-24 ENCOUNTER — Other Ambulatory Visit: Payer: Self-pay | Admitting: Otolaryngology

## 2019-03-03 ENCOUNTER — Inpatient Hospital Stay (HOSPITAL_COMMUNITY)
Admission: RE | Admit: 2019-03-03 | Discharge: 2019-03-03 | Disposition: A | Discharge status: Home / Self Care | Payer: Medicare Other | Source: Ambulatory Visit

## 2019-03-03 ENCOUNTER — Inpatient Hospital Stay (HOSPITAL_COMMUNITY): Admission: RE | Admit: 2019-03-03 | Payer: Medicare Other | Source: Ambulatory Visit

## 2019-03-03 NOTE — Pre-Procedure Instructions (Signed)
Joel Leblanc  03/03/2019     Your procedure is scheduled on Wednesday, October 28.  Report to [facility], Main Entrance or Entrance "A" at 8:00 AM                    Your surgery or procedure is scheduled for 10:00 A.M.   Call this number if you have problems the morning of surgery: [phone]  This is the number for the Pre- Surgical Desk.                    For any other questions prior to surgery Monday - Friday, 8:00 AM - 4:00 PM, call [phone] PAT desk, ask to speak to any nurse.   Remember:  Do not eat or drink after midnight Tuesday, October 27.    Take these medicines the morning of surgery with A SIP OF WATER :             Dexlansoprazole (DEXILANT)              methadone (DOLOPHINE)               Use budesonide-formoterol (SYMBICORT) inhaler  Take/Use as needed: albuterol (PROVENTIL HFA;VENTOLIN HFA) - please bring it to the hospital with you naloxone (NARCAN) ondansetron (ZOFRAN) oxyCODONE (ROXICODONE)  SUMAtriptan (IMITREX)  STOP taking Aspirin, Aspirin Products (Goody Powder, Excedrin Migraine), Ibuprofen (Advil), Naproxen (Aleve), Vitamins and Herbal Products (ie Fish Oil).   [place]- Preparing For Surgery  Before surgery, you can play an important role. Because skin is not sterile, your skin needs to be as free of germs as possible. You can reduce the number of germs on your skin by washing with CHG (chlorahexidine gluconate) Soap before surgery.  CHG is an antiseptic cleaner which kills germs and bonds with the skin to continue killing germs even after washing.    Oral Hygiene is also important to reduce your risk of infection.  Remember - BRUSH YOUR TEETH THE MORNING OF SURGERY WITH YOUR REGULAR TOOTHPASTE  Please do not use if you have an allergy to CHG or antibacterial soaps. If your skin becomes reddened/irritated stop using the CHG.  Do not shave (including legs and underarms) for at least 48 hours prior to first CHG shower.  It is OK to shave your face.  Please follow these instructions carefully.   1. Shower the NIGHT BEFORE SURGERY and the MORNING OF SURGERY with CHG.   2. If you chose to wash your hair, wash your hair first as usual with your normal shampoo.  3. After you shampoo, wash your face and private area with the soap you use at home, then rinse your hair and body thoroughly to remove the shampoo and soap.  4. Use CHG as you would any other liquid soap. You can apply CHG directly to the skin and wash gently with a scrungie or a clean washcloth.   5. Apply the CHG Soap to your body ONLY FROM THE NECK DOWN.  Do not use on open wounds or open sores. Avoid contact with your eyes, ears, mouth and genitals (private parts).  6. Wash thoroughly, paying special attention to the area where your surgery will be performed.  7. Thoroughly rinse your body with warm water from the neck down.  8. DO NOT shower/wash with your normal soap after using and rinsing off the CHG Soap.  9. Pat yourself dry with a CLEAN TOWEL.  10. Wear CLEAN  PAJAMAS to bed the night before surgery, wear comfortable clothes the morning of surgery  11. Place CLEAN SHEETS on your bed the night of your first shower and DO NOT SLEEP WITH PETS.  Day of Surgery: Shower as instructed above. Do not wear lotions, powders, colognes, or deodorant. Please wear clean clothes to the hospital/surgery center.   Remember to brush your teeth WITH YOUR REGULAR TOOTHPASTE.  Do not wear jewelry, make-up or nail polish.             Men may shave face and neck.  Do not bring valuables to the hospital.  [facility] is not responsible for any belongings or valuables.  Contacts, dentures or bridgework may not be worn into surgery.  Leave your suitcase in the car.  After surgery it may be brought to your room.  For patients admitted to the hospital, discharge time will be determined by your treatment team.  Patients discharged the day of surgery will not  be allowed to drive home.   Please read over the following fact sheets that you were given: Coughing and Deep Breathing, Surgical Site Infections.

## 2019-03-03 NOTE — Progress Notes (Signed)
Joel Leblanc did not arrive for PAT appointment, [name] called patient,  who said he is not having surgery due to cost and that he notified the office.  I called the office and spoke with scheduler, Heathter, she said she will have him removed.

## 2019-03-05 ENCOUNTER — Ambulatory Visit (HOSPITAL_COMMUNITY): Admission: RE | Admit: 2019-03-05 | Payer: Medicare Other | Source: Home / Self Care | Admitting: Otolaryngology

## 2019-03-05 SURGERY — TONSILLECTOMY AND ADENOIDECTOMY
Anesthesia: General

## 2019-04-08 ENCOUNTER — Ambulatory Visit (INDEPENDENT_AMBULATORY_CARE_PROVIDER_SITE_OTHER): Payer: Medicare Other | Admitting: Family Medicine

## 2019-04-08 ENCOUNTER — Encounter: Payer: Self-pay | Admitting: Family Medicine

## 2019-04-08 ENCOUNTER — Other Ambulatory Visit: Payer: Self-pay

## 2019-04-08 VITALS — BP 153/89 | HR 80 | Temp 98.8°F | Resp 24 | Ht 72.0 in | Wt 247.0 lb

## 2019-04-08 DIAGNOSIS — J4540 Moderate persistent asthma, uncomplicated: Secondary | ICD-10-CM

## 2019-04-08 DIAGNOSIS — F331 Major depressive disorder, recurrent, moderate: Secondary | ICD-10-CM

## 2019-04-08 DIAGNOSIS — R0600 Dyspnea, unspecified: Secondary | ICD-10-CM | POA: Diagnosis not present

## 2019-04-08 DIAGNOSIS — F988 Other specified behavioral and emotional disorders with onset usually occurring in childhood and adolescence: Secondary | ICD-10-CM

## 2019-04-08 DIAGNOSIS — D5720 Sickle-cell/Hb-C disease without crisis: Secondary | ICD-10-CM

## 2019-04-08 DIAGNOSIS — R0681 Apnea, not elsewhere classified: Secondary | ICD-10-CM

## 2019-04-08 DIAGNOSIS — R0609 Other forms of dyspnea: Secondary | ICD-10-CM

## 2019-04-08 DIAGNOSIS — J069 Acute upper respiratory infection, unspecified: Secondary | ICD-10-CM

## 2019-04-08 MED ORDER — AZITHROMYCIN 250 MG PO TABS: ORAL_TABLET | ORAL | 0 refills | Status: DC

## 2019-04-08 MED ORDER — PREDNISONE 10 MG (21) PO TBPK: ORAL_TABLET | ORAL | 0 refills | Status: DC
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
## 2019-04-08 NOTE — Patient Instructions (Signed)

## 2019-04-11 ENCOUNTER — Other Ambulatory Visit: Payer: Self-pay

## 2019-04-11 ENCOUNTER — Ambulatory Visit (INDEPENDENT_AMBULATORY_CARE_PROVIDER_SITE_OTHER): Payer: Medicare Other | Admitting: Family Medicine

## 2019-04-11 DIAGNOSIS — R0989 Other specified symptoms and signs involving the circulatory and respiratory systems: Secondary | ICD-10-CM

## 2019-04-11 DIAGNOSIS — R0600 Dyspnea, unspecified: Secondary | ICD-10-CM | POA: Diagnosis not present

## 2019-04-11 DIAGNOSIS — G894 Chronic pain syndrome: Secondary | ICD-10-CM

## 2019-04-11 DIAGNOSIS — D5720 Sickle-cell/Hb-C disease without crisis: Secondary | ICD-10-CM | POA: Diagnosis not present

## 2019-04-11 DIAGNOSIS — R0609 Other forms of dyspnea: Secondary | ICD-10-CM
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
## 2019-05-14 ENCOUNTER — Encounter (HOSPITAL_COMMUNITY): Payer: Self-pay | Admitting: Family Medicine

## 2019-05-14 ENCOUNTER — Telehealth (HOSPITAL_COMMUNITY): Payer: Self-pay | Admitting: General Practice

## 2019-05-14 ENCOUNTER — Non-Acute Institutional Stay (HOSPITAL_COMMUNITY)
Admission: AD | Admit: 2019-05-14 | Discharge: 2019-05-14 | Disposition: A | Discharge status: Home / Self Care | Payer: Medicare Other | Source: Ambulatory Visit | Attending: Internal Medicine | Admitting: Internal Medicine

## 2019-05-14 DIAGNOSIS — J45909 Unspecified asthma, uncomplicated: Secondary | ICD-10-CM | POA: Insufficient documentation

## 2019-05-14 DIAGNOSIS — D5700 Hb-SS disease with crisis, unspecified: Secondary | ICD-10-CM | POA: Diagnosis present

## 2019-05-14 DIAGNOSIS — F329 Major depressive disorder, single episode, unspecified: Secondary | ICD-10-CM | POA: Diagnosis not present

## 2019-05-14 DIAGNOSIS — F411 Generalized anxiety disorder: Secondary | ICD-10-CM | POA: Diagnosis not present

## 2019-05-14 DIAGNOSIS — Z79899 Other long term (current) drug therapy: Secondary | ICD-10-CM | POA: Insufficient documentation

## 2019-05-14 DIAGNOSIS — Z833 Family history of diabetes mellitus: Secondary | ICD-10-CM | POA: Insufficient documentation

## 2019-05-14 DIAGNOSIS — G894 Chronic pain syndrome: Secondary | ICD-10-CM | POA: Diagnosis not present

## 2019-05-14 DIAGNOSIS — F4310 Post-traumatic stress disorder, unspecified: Secondary | ICD-10-CM | POA: Insufficient documentation

## 2019-05-14 DIAGNOSIS — K219 Gastro-esophageal reflux disease without esophagitis: Secondary | ICD-10-CM | POA: Diagnosis not present

## 2019-05-14 DIAGNOSIS — F1120 Opioid dependence, uncomplicated: Secondary | ICD-10-CM | POA: Diagnosis not present

## 2019-05-14 DIAGNOSIS — Z7951 Long term (current) use of inhaled steroids: Secondary | ICD-10-CM | POA: Insufficient documentation

## 2019-05-14 LAB — RETICULOCYTES
Immature Retic Fract: 35.7 % — ABNORMAL HIGH (ref 2.3–15.9)
RBC.: 5 MIL/uL (ref 4.22–5.81)
Retic Count, Absolute: 233 10*3/uL — ABNORMAL HIGH (ref 19.0–186.0)
Retic Ct Pct: 4.7 % — ABNORMAL HIGH (ref 0.4–3.1)

## 2019-05-14 LAB — CBC WITH DIFFERENTIAL/PLATELET
Abs Immature Granulocytes: 0.07 10*3/uL (ref 0.00–0.07)
Basophils Absolute: 0.1 10*3/uL (ref 0.0–0.1)
Basophils Relative: 1 %
Eosinophils Absolute: 0.1 10*3/uL (ref 0.0–0.5)
Eosinophils Relative: 1 %
HCT: 37.3 % — ABNORMAL LOW (ref 39.0–52.0)
Hemoglobin: 13 g/dL (ref 13.0–17.0)
Immature Granulocytes: 1 %
Lymphocytes Relative: 24 %
Lymphs Abs: 3.1 10*3/uL (ref 0.7–4.0)
MCH: 25.8 pg — ABNORMAL LOW (ref 26.0–34.0)
MCHC: 34.9 g/dL (ref 30.0–36.0)
MCV: 74.2 fL — ABNORMAL LOW (ref 80.0–100.0)
Monocytes Absolute: 1.1 10*3/uL — ABNORMAL HIGH (ref 0.1–1.0)
Monocytes Relative: 9 %
Neutro Abs: 8.4 10*3/uL — ABNORMAL HIGH (ref 1.7–7.7)
Neutrophils Relative %: 64 %
Platelets: 558 10*3/uL — ABNORMAL HIGH (ref 150–400)
RBC: 5.03 MIL/uL (ref 4.22–5.81)
RDW: 16 % — ABNORMAL HIGH (ref 11.5–15.5)
WBC: 12.8 10*3/uL — ABNORMAL HIGH (ref 4.0–10.5)
nRBC: 0.3 % — ABNORMAL HIGH (ref 0.0–0.2)

## 2019-05-14 LAB — COMPREHENSIVE METABOLIC PANEL
ALT: 30 U/L (ref 0–44)
AST: 29 U/L (ref 15–41)
Albumin: 4.3 g/dL (ref 3.5–5.0)
Alkaline Phosphatase: 63 U/L (ref 38–126)
Anion gap: 9 (ref 5–15)
BUN: 11 mg/dL (ref 6–20)
CO2: 26 mmol/L (ref 22–32)
Calcium: 9.4 mg/dL (ref 8.9–10.3)
Chloride: 105 mmol/L (ref 98–111)
Creatinine, Ser: 0.79 mg/dL (ref 0.61–1.24)
GFR calc Af Amer: >60 mL/min (ref >60)
GFR calc non Af Amer: >60 mL/min (ref >60)
Glucose, Bld: 140 mg/dL — ABNORMAL HIGH (ref 70–99)
Potassium: 3.4 mmol/L — ABNORMAL LOW (ref 3.5–5.1)
Sodium: 140 mmol/L (ref 135–145)
Total Bilirubin: 1.3 mg/dL — ABNORMAL HIGH (ref 0.3–1.2)
Total Protein: 7.9 g/dL (ref 6.5–8.1)

## 2019-05-14 MED ORDER — NALOXONE HCL 0.4 MG/ML IJ SOLN: 0.4000 mg | INTRAMUSCULAR | Status: DC | PRN

## 2019-05-14 MED ORDER — ACETAMINOPHEN 500 MG PO TABS
1000.0000 mg | ORAL_TABLET | Freq: Once | ORAL | Status: AC
  Administered 2019-05-14: 1000 mg via ORAL
  Filled 2019-05-14: qty 2

## 2019-05-14 MED ORDER — SODIUM CHLORIDE 0.9% FLUSH: 9.0000 mL | INTRAVENOUS | Status: DC | PRN

## 2019-05-14 MED ORDER — KETOROLAC TROMETHAMINE 30 MG/ML IJ SOLN
15.0000 mg | Freq: Once | INTRAMUSCULAR | Status: AC
  Administered 2019-05-14: 15 mg via INTRAVENOUS
  Filled 2019-05-14: qty 1

## 2019-05-14 MED ORDER — SODIUM CHLORIDE 0.45 % IV SOLN: INTRAVENOUS | Status: DC

## 2019-05-14 MED ORDER — ONDANSETRON HCL 4 MG/2ML IJ SOLN
4.0000 mg | Freq: Four times a day (QID) | INTRAMUSCULAR | Status: DC | PRN
  Administered 2019-05-14: 4 mg via INTRAVENOUS
  Filled 2019-05-14: qty 2

## 2019-05-14 MED ORDER — SODIUM CHLORIDE 0.9 % IV SOLN
25.0000 mg | INTRAVENOUS | Status: DC | PRN
  Filled 2019-05-14: qty 0.5

## 2019-05-14 MED ORDER — HYDROMORPHONE 1 MG/ML IV SOLN
INTRAVENOUS | Status: DC
  Administered 2019-05-14: 30 mg via INTRAVENOUS
  Administered 2019-05-14: 7 mg via INTRAVENOUS
  Filled 2019-05-14: qty 30

## 2019-05-14 MED ORDER — DIPHENHYDRAMINE HCL 25 MG PO CAPS: 25.0000 mg | ORAL_CAPSULE | ORAL | Status: DC | PRN

## 2019-05-14 NOTE — Discharge Instructions (Signed)
Sickle Cell Anemia, Adult  Sickle cell anemia is a condition where your red blood cells are shaped like sickles. Red blood cells carry oxygen through the body. Sickle-shaped cells do not live as long as normal red blood cells. They also clump together and block blood from flowing through the blood vessels. This prevents the body from getting enough oxygen. Sickle cell anemia causes organ damage and pain. It also increases the risk of infection. Follow these instructions at home: Medicines  Take over-the-counter and prescription medicines only as told by your doctor.  If you were prescribed an antibiotic medicine, take it as told by your doctor. Do not stop taking the antibiotic even if you start to feel better.  If you develop a fever, do not take medicines to lower the fever right away. Tell your doctor about the fever. Managing pain, stiffness, and swelling  Try these methods to help with pain: ? Use a heating pad. ? Take a warm bath. ? Distract yourself, such as by watching TV. Eating and drinking  Drink enough fluid to keep your pee (urine) clear or pale yellow. Drink more in hot weather and during exercise.  Limit or avoid alcohol.  Eat a healthy diet. Eat plenty of fruits, vegetables, whole grains, and lean protein.  Take vitamins and supplements as told by your doctor. Traveling  When traveling, keep these with you: ? Your medical information. ? The names of your doctors. ? Your medicines.  If you need to take an airplane, talk to your doctor first. Activity  Rest often.  Avoid exercises that make your heart beat much faster, such as jogging. General instructions  Do not use products that have nicotine or tobacco, such as cigarettes and e-cigarettes. If you need help quitting, ask your doctor.  Consider wearing a medical alert bracelet.  Avoid being in high places (high altitudes), such as mountains.  Avoid very hot or cold temperatures.  Avoid places where the  temperature changes a lot.  Keep all follow-up visits as told by your doctor. This is important. Contact a doctor if:  A joint hurts.  Your feet or hands hurt or swell.  You feel tired (fatigued). Get help right away if:  You have symptoms of infection. These include: ? Fever. ? Chills. ? Being very tired. ? Irritability. ? Poor eating. ? Throwing up (vomiting).  You feel dizzy or faint.  You have new stomach pain, especially on the left side.  You have a an erection (priapism) that lasts more than 4 hours.  You have numbness in your arms or legs.  You have a hard time moving your arms or legs.  You have trouble talking.  You have pain that does not go away when you take medicine.  You are short of breath.  You are breathing fast.  You have a long-term cough.  You have pain in your chest.  You have a bad headache.  You have a stiff neck.  Your stomach looks bloated even though you did not eat much.  Your skin is pale.  You suddenly cannot see well. Summary  Sickle cell anemia is a condition where your red blood cells are shaped like sickles.  Follow your doctor's advice on ways to manage pain, food to eat, activities to do, and steps to take for safe travel.  Get medical help right away if you have any signs of infection, such as a fever. This information is not intended to replace advice given to you by   your health care provider. Make sure you discuss any questions you have with your health care provider. Document Revised: 08/16/2018 Document Reviewed: 05/30/2016 Elsevier Patient Education  2020 [employer]  

## 2019-05-14 NOTE — Progress Notes (Signed)
Dilaudid PCA waste 22 mg. Co-signed by Mauricio Po

## 2019-05-14 NOTE — Telephone Encounter (Signed)
Patient called, complained of pain in the legs and hands rated at 7/10. Denied chest pain, fever, diarrhea, abdominal pain, nausea/vomitting. Screened negative for Covid-19 symptoms. Admitted to having means of transportation without driving self after treatment. Ran out of prescribed pain medications "2 days ago". Per provider, patient can come to the day hospital for treatment. Patient notified, verbalized understanding.

## 2019-05-14 NOTE — Progress Notes (Signed)
Patient admitted to the day hospital for treatment of sickle cell pain crisis. Patient reported generalized pain rated 7/10. Patient placed on Dilaudid PCA, given PO Tylenol, IV Toradol and hydrated with IV fluids. At discharge patient reported  pain at 1/10. Discharge instructions given to patient. Patient alert, oriented and ambulatory at discharge.
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
## 2019-05-15 NOTE — Discharge Summary (Signed)
[facility] Discharge Summary   [name] MRN: [id number] DOB/AGE: 1980-10-28 39 y.o.  Admit date: 05/14/2019 Discharge date: 05/15/2019  Primary Care Physician:  Dorena Dew, FNP  Admission Diagnoses:  Active Problems:   Sickle cell pain crisis [facility])   Discharge Medications:  Allergies as of 05/14/2019      Reactions   Hydroxyurea Hives   Tape Itching   Adhesive Tape   Latex Rash   Morphine And Related Nausea And Vomiting, Rash      Medication List    TAKE these medications   Ajovy 225 MG/1.5ML Sosy Generic drug: Fremanezumab-vfrm Inject 225 mg into the skin every 30 (thirty) days.   albuterol 108 (90 Base) MCG/ACT inhaler Commonly known as: VENTOLIN HFA Inhale into the lungs every 6 (six) hours as needed for wheezing or shortness of breath.   azithromycin 250 MG tablet Commonly known as: ZITHROMAX 500 mg day 1, days 2-5 take 250 mg   budesonide-formoterol 160-4.5 MCG/ACT inhaler Commonly known as: SYMBICORT Inhale 2 puffs into the lungs daily.   Dexilant 30 MG capsule Generic drug: Dexlansoprazole Take 30 mg by mouth daily.   ergocalciferol 1.25 MG (50000 UT) capsule Commonly known as: VITAMIN D2 Take 1 capsule (50,000 Units total) by mouth once a week. What changed: additional instructions   folic acid 1 MG tablet Commonly known as: FOLVITE Take 1 mcg by mouth daily.   ibuprofen 200 MG tablet Commonly known as: ADVIL Take 600 mg by mouth every 6 (six) hours as needed for moderate pain.   methadone 10 MG tablet Commonly known as: DOLOPHINE Take 10 mg by mouth 2 (two) times daily.   Multi-Vitamin tablet Take 1 tablet by mouth daily.   Narcan 4 MG/0.1ML Liqd nasal spray kit Generic drug: naloxone Place 4 mg into the nose as needed.   ondansetron 4 MG tablet Commonly known as: ZOFRAN Take 4 mg by mouth every 8 (eight) hours as needed for refractory nausea / vomiting.   oxyCODONE 15 MG immediate release  tablet Commonly known as: ROXICODONE Take 1 tablet (15 mg total) by mouth every 4 (four) hours as needed for pain.   predniSONE 10 MG (21) Tbpk tablet Commonly known as: STERAPRED UNI-PAK 21 TAB Per instruction   pregabalin 150 MG capsule Commonly known as: LYRICA Take 150 mg by mouth 3 (three) times daily.   promethazine 25 MG tablet Commonly known as: PHENERGAN Take 25 mg by mouth as needed for nausea or vomiting.   SUMAtriptan 100 MG tablet Commonly known as: IMITREX Take 1 tablet by mouth as needed for migraine.   traZODone 50 MG tablet Commonly known as: DESYREL Take 50 mg by mouth as needed for sleep.        Consults:  None  Significant Diagnostic Studies:  No results found. History of present illness:  Joel Leblanc, a 39 year old male with a medical history significant for sickle cell disease, type [place], chronic pain syndrome, opiate dependence and tolerance, history of major depression, history of PTSD, and generalized anxiety presented complaining of generalized pain.  Pain is consistent with his typical crisis.  He attributes current pain crisis to changes in weather.  Patient says that he last had Lyrica this a.m. without sustained relief.  He has been out of opiate medications for the past 2-3 days.  He is under the care of [facility].  Pain intensity is 7/10 characterized as constant and aching.  Patient endorses headache.  He denies  blurred vision, dizziness, paresthesias, urinary symptoms, nausea, vomiting, or diarrhea.  No fever, chills, persistent cough, shortness of breath, or chest pain.  No sick contacts, recent travel, or closure to COVID-19.  [facility] Course:  Patient admitted to sickle cell day infusion center for management of pain crisis. All laboratory values consistent with patient's baseline.  Vital signs reassuring. Pain managed with IV Dilaudid via PCA with settings of 0.5 mg, 10-minute lockout, and 3 mg/h. Toradol 15 mg  IV x1 dose Tylenol 1000 mg by mouth x1 dose IV fluids, 0.45% saline at 100 mL/h Pain intensity decreased to 1/10.  Patient does not warrant admission on today.  Advised to follow-up with PCP and pain management as scheduled. He is alert, oriented, and ambulating without assistance. He will discharge home in a hemodynamically stable condition  Discharge instructions: Resume all home medications.   Follow up with PCP as previously  scheduled.   Discussed the importance of drinking 64 ounces of water daily, dehydration of red blood cells may lead further sickling.   Avoid all stressors that precipitate sickle cell pain crisis.     The patient was given clear instructions to go to ER or return to medical center if symptoms do not improve, worsen or new problems develop.    Physical Exam at Discharge:  BP 140/82 (BP Location: Right Arm)   Pulse 82   Temp 97.6 F (36.4 C) (Oral)   Resp 16   SpO2 96%  Physical Exam Constitutional:      Appearance: Normal appearance.  HENT:     Nose: Nose normal.  Eyes:     Pupils: Pupils are equal, round, and reactive to light.  Cardiovascular:     Rate and Rhythm: Normal rate and regular rhythm.  Pulmonary:     Effort: Pulmonary effort is normal.  Abdominal:     General: Abdomen is flat. Bowel sounds are normal.  Musculoskeletal:        General: Normal range of motion.  Skin:    General: Skin is warm.  Neurological:     General: No focal deficit present.     Mental Status: He is alert. Mental status is at baseline.  Psychiatric:        Mood and Affect: Mood normal.        Behavior: Behavior normal.        Thought Content: Thought content normal.        Judgment: Judgment normal.       Disposition at Discharge: Discharge disposition: 01-Home or Self Care       Discharge Orders: Discharge Instructions    Discharge patient   Complete by: As directed    Discharge disposition: 01-Home or Self Care   Discharge patient date:  05/14/2019      Condition at Discharge:   Stable  Time spent on Discharge:  Greater than 30 minutes.  Signed: Donia Pounds  APRN, MSN, FNP-C [facility] [address] [place], [place] [address] [phone]  05/15/2019, 3:21 PM

## 2019-06-10 ENCOUNTER — Other Ambulatory Visit (HOSPITAL_COMMUNITY): Payer: Medicare Other | Attending: Internal Medicine

## 2019-06-12 ENCOUNTER — Encounter (HOSPITAL_BASED_OUTPATIENT_CLINIC_OR_DEPARTMENT_OTHER): Payer: Medicare Other | Admitting: Internal Medicine

## 2019-07-17 ENCOUNTER — Other Ambulatory Visit: Payer: Self-pay | Admitting: Otolaryngology

## 2019-07-18 NOTE — Pre-Procedure Instructions (Signed)
Joel Leblanc  07/18/2019      [id number] - [place], [place] - [address] [place] [address] Phone: [phone] Fax: [place], [place] - [facility] [address] [place] [facility] [place] [address] Phone: [phone] Fax: [phone]    Your procedure is scheduled on 07/23/19.  Report to [facility] Admitting at 8 A.M.  Call this number if you have problems the morning of surgery:  [phone]   Remember:  Do not eat or drink after midnight.   Take these medicines the morning of surgery with A SIP OF WATER ---- ALL INHALERS,OXYCODONE,LYRICA,METHADONE,DEXILANT    Do not wear jewelry, make-up or nail polish.  Do not wear lotions, powders, or perfumes, or deodorant.  Do not shave 48 hours prior to surgery.  Men may shave face and neck.  Do not bring valuables to the hospital.  [facility] is not responsible for any belongings or valuables.  Contacts, dentures or bridgework may not be worn into surgery.  Leave your suitcase in the car.  After surgery it may be brought to your room.  For patients admitted to the hospital, discharge time will be determined by your treatment team.  Patients discharged the day of surgery will not be allowed to drive home.   Do not take any aspirin,anti-inflammatories,vitamins,or herbal supplements 5-7 days prior to surgery.[place] - Preparing for Surgery  Before surgery, you can play an important role.  Because skin is not sterile, your skin needs to be as free of germs as possible.  You can reduce the number of germs on you skin by washing with CHG (chlorahexidine gluconate) soap before surgery.  CHG is an antiseptic cleaner which kills germs and bonds with the skin to continue killing germs even after washing.  Oral Hygiene is also important in reducing the risk of infection.  Remember to brush your teeth with your regular toothpaste the  morning of surgery.  Please DO NOT use if you have an allergy to CHG or antibacterial soaps.  If your skin becomes reddened/irritated stop using the CHG and inform your nurse when you arrive at Short Stay.  Do not shave (including legs and underarms) for at least 48 hours prior to the first CHG shower.  You may shave your face.  Please follow these instructions carefully:   1.  Shower with CHG Soap the night before surgery and the morning of Surgery.  2.  If you choose to wash your hair, wash your hair first as usual with your normal shampoo.  3.  After you shampoo, rinse your hair and body thoroughly to remove the shampoo. 4.  Use CHG as you would any other liquid soap.  You can apply chg directly to the skin and wash gently with a      scrungie or washcloth.           5.  Apply the CHG Soap to your body ONLY FROM THE NECK DOWN.   Do not use on open wounds or open sores. Avoid contact with your eyes, ears, mouth and genitals (private parts).  Wash genitals (private parts) with your normal soap.  6.  Wash thoroughly, paying special attention to the area where your surgery will be performed.  7.  Thoroughly rinse your body with warm water from the neck down.  8.  DO NOT shower/wash with your  normal soap after using and rinsing off the CHG Soap.  9.  Pat yourself dry with a clean towel.            10.  Wear clean pajamas.            11.  Place clean sheets on your bed the night of your first shower and do not sleep with pets.  Day of Surgery  Do not apply any lotions/deoderants the morning of surgery.   Please wear clean clothes to the hospital/surgery center. Remember to brush your teeth with toothpaste.  n   Please read over the following fact sheets that you were given.

## 2019-07-21 ENCOUNTER — Inpatient Hospital Stay (HOSPITAL_COMMUNITY)
Admission: RE | Admit: 2019-07-21 | Discharge: 2019-07-21 | Disposition: A | Discharge status: Home / Self Care | Payer: Medicare Other | Source: Ambulatory Visit

## 2019-07-21 ENCOUNTER — Other Ambulatory Visit (HOSPITAL_COMMUNITY)
Admission: RE | Admit: 2019-07-21 | Discharge: 2019-07-21 | Disposition: A | Discharge status: Home / Self Care | Payer: Medicare Other | Source: Ambulatory Visit | Attending: Otolaryngology | Admitting: Otolaryngology

## 2019-07-21 ENCOUNTER — Encounter (HOSPITAL_COMMUNITY): Payer: Self-pay

## 2019-07-21 DIAGNOSIS — Z20822 Contact with and (suspected) exposure to covid-19: Secondary | ICD-10-CM | POA: Diagnosis not present

## 2019-07-21 DIAGNOSIS — Z01812 Encounter for preprocedural laboratory examination: Secondary | ICD-10-CM | POA: Insufficient documentation

## 2019-07-21 HISTORY — DX: Sleep apnea, unspecified: G47.30

## 2019-07-21 LAB — SARS CORONAVIRUS 2 (TAT 6-24 HRS): SARS Coronavirus 2: NEGATIVE

## 2019-07-23 ENCOUNTER — Other Ambulatory Visit: Payer: Self-pay | Admitting: Family Medicine

## 2019-07-23 ENCOUNTER — Other Ambulatory Visit: Payer: Self-pay

## 2019-07-23 ENCOUNTER — Ambulatory Visit (HOSPITAL_COMMUNITY): Payer: Medicare Other | Admitting: Certified Registered Nurse Anesthetist

## 2019-07-23 ENCOUNTER — Encounter (HOSPITAL_COMMUNITY)
Admission: RE | Disposition: A | Discharge status: Home / Self Care | Payer: Self-pay | Source: Home / Self Care | Attending: Otolaryngology

## 2019-07-23 ENCOUNTER — Ambulatory Visit (HOSPITAL_COMMUNITY)
Admission: RE | Admit: 2019-07-23 | Discharge: 2019-07-23 | Disposition: A | Discharge status: Home / Self Care | Payer: Medicare Other | Attending: Otolaryngology | Admitting: Otolaryngology

## 2019-07-23 ENCOUNTER — Encounter (HOSPITAL_COMMUNITY): Payer: Self-pay | Admitting: Otolaryngology

## 2019-07-23 DIAGNOSIS — J3501 Chronic tonsillitis: Secondary | ICD-10-CM | POA: Insufficient documentation

## 2019-07-23 DIAGNOSIS — J312 Chronic pharyngitis: Secondary | ICD-10-CM | POA: Insufficient documentation

## 2019-07-23 DIAGNOSIS — G4730 Sleep apnea, unspecified: Secondary | ICD-10-CM | POA: Diagnosis not present

## 2019-07-23 DIAGNOSIS — J353 Hypertrophy of tonsils with hypertrophy of adenoids: Secondary | ICD-10-CM | POA: Diagnosis not present

## 2019-07-23 DIAGNOSIS — Z8673 Personal history of transient ischemic attack (TIA), and cerebral infarction without residual deficits: Secondary | ICD-10-CM | POA: Insufficient documentation

## 2019-07-23 DIAGNOSIS — E559 Vitamin D deficiency, unspecified: Secondary | ICD-10-CM

## 2019-07-23 DIAGNOSIS — D571 Sickle-cell disease without crisis: Secondary | ICD-10-CM | POA: Insufficient documentation

## 2019-07-23 DIAGNOSIS — K219 Gastro-esophageal reflux disease without esophagitis: Secondary | ICD-10-CM | POA: Insufficient documentation

## 2019-07-23 HISTORY — PX: TONSILLECTOMY AND ADENOIDECTOMY: SHX28

## 2019-07-23 LAB — COMPREHENSIVE METABOLIC PANEL
ALT: 29 U/L (ref 0–44)
AST: 36 U/L (ref 15–41)
Albumin: 3.8 g/dL (ref 3.5–5.0)
Alkaline Phosphatase: 56 U/L (ref 38–126)
Anion gap: 9 (ref 5–15)
BUN: 7 mg/dL (ref 6–20)
CO2: 24 mmol/L (ref 22–32)
Calcium: 9.3 mg/dL (ref 8.9–10.3)
Chloride: 110 mmol/L (ref 98–111)
Creatinine, Ser: 0.77 mg/dL (ref 0.61–1.24)
GFR calc Af Amer: >60 mL/min (ref >60)
GFR calc non Af Amer: >60 mL/min (ref >60)
Glucose, Bld: 78 mg/dL (ref 70–99)
Potassium: 3.4 mmol/L — ABNORMAL LOW (ref 3.5–5.1)
Sodium: 143 mmol/L (ref 135–145)
Total Bilirubin: 1.3 mg/dL — ABNORMAL HIGH (ref 0.3–1.2)
Total Protein: 7 g/dL (ref 6.5–8.1)

## 2019-07-23 LAB — CBC
HCT: 31.8 % — ABNORMAL LOW (ref 39.0–52.0)
Hemoglobin: 11.2 g/dL — ABNORMAL LOW (ref 13.0–17.0)
MCH: 26.1 pg (ref 26.0–34.0)
MCHC: 35.2 g/dL (ref 30.0–36.0)
MCV: 74.1 fL — ABNORMAL LOW (ref 80.0–100.0)
Platelets: 428 10*3/uL — ABNORMAL HIGH (ref 150–400)
RBC: 4.29 MIL/uL (ref 4.22–5.81)
RDW: 15.1 % (ref 11.5–15.5)
WBC: 7.7 10*3/uL (ref 4.0–10.5)
nRBC: 0.5 % — ABNORMAL HIGH (ref 0.0–0.2)

## 2019-07-23 SURGERY — TONSILLECTOMY AND ADENOIDECTOMY
Anesthesia: General | Site: Mouth

## 2019-07-23 MED ORDER — SODIUM CHLORIDE 0.9 % IR SOLN
Status: DC | PRN
  Administered 2019-07-23: 1000 mL

## 2019-07-23 MED ORDER — AMOXICILLIN 400 MG/5ML PO SUSR: 800.0000 mg | Freq: Two times a day (BID) | ORAL | 0 refills | Status: AC

## 2019-07-23 MED ORDER — DEXAMETHASONE SODIUM PHOSPHATE 10 MG/ML IJ SOLN
INTRAMUSCULAR | Status: DC | PRN
  Administered 2019-07-23: 10 mg via INTRAVENOUS

## 2019-07-23 MED ORDER — SUMATRIPTAN SUCCINATE 25 MG PO TABS
25.0000 mg | ORAL_TABLET | Freq: Once | ORAL | Status: DC
  Filled 2019-07-23: qty 1

## 2019-07-23 MED ORDER — OXYMETAZOLINE HCL 0.05 % NA SOLN
NASAL | Status: AC
  Filled 2019-07-23: qty 30

## 2019-07-23 MED ORDER — MIDAZOLAM HCL 2 MG/2ML IJ SOLN
INTRAMUSCULAR | Status: AC
  Filled 2019-07-23: qty 2

## 2019-07-23 MED ORDER — FENTANYL CITRATE (PF) 100 MCG/2ML IJ SOLN: 25.0000 ug | INTRAMUSCULAR | Status: DC | PRN

## 2019-07-23 MED ORDER — PROMETHAZINE HCL 25 MG/ML IJ SOLN: 6.2500 mg | INTRAMUSCULAR | Status: DC | PRN

## 2019-07-23 MED ORDER — FENTANYL CITRATE (PF) 250 MCG/5ML IJ SOLN
INTRAMUSCULAR | Status: AC
  Filled 2019-07-23: qty 5

## 2019-07-23 MED ORDER — 0.9 % SODIUM CHLORIDE (POUR BTL) OPTIME
TOPICAL | Status: DC | PRN
  Administered 2019-07-23: 1000 mL

## 2019-07-23 MED ORDER — SUGAMMADEX SODIUM 200 MG/2ML IV SOLN
INTRAVENOUS | Status: DC | PRN
  Administered 2019-07-23: 200 mg via INTRAVENOUS

## 2019-07-23 MED ORDER — ONDANSETRON HCL 4 MG/2ML IJ SOLN
INTRAMUSCULAR | Status: DC | PRN
  Administered 2019-07-23: 4 mg via INTRAVENOUS

## 2019-07-23 MED ORDER — DEXMEDETOMIDINE HCL IN NACL 200 MCG/50ML IV SOLN
INTRAVENOUS | Status: DC | PRN
  Administered 2019-07-23: 40 ug via INTRAVENOUS

## 2019-07-23 MED ORDER — LIDOCAINE 2% (20 MG/ML) 5 ML SYRINGE
INTRAMUSCULAR | Status: DC | PRN
  Administered 2019-07-23: 100 mg via INTRAVENOUS

## 2019-07-23 MED ORDER — FENTANYL CITRATE (PF) 250 MCG/5ML IJ SOLN
INTRAMUSCULAR | Status: DC | PRN
  Administered 2019-07-23 (×3): 50 ug via INTRAVENOUS
  Administered 2019-07-23: 150 ug via INTRAVENOUS

## 2019-07-23 MED ORDER — LACTATED RINGERS IV SOLN: INTRAVENOUS | Status: DC

## 2019-07-23 MED ORDER — OXYMETAZOLINE HCL 0.05 % NA SOLN
NASAL | Status: DC | PRN
  Administered 2019-07-23: 1

## 2019-07-23 MED ORDER — PROPOFOL 10 MG/ML IV BOLUS
INTRAVENOUS | Status: DC | PRN
  Administered 2019-07-23: 200 mg via INTRAVENOUS

## 2019-07-23 MED ORDER — ROCURONIUM BROMIDE 10 MG/ML (PF) SYRINGE
PREFILLED_SYRINGE | INTRAVENOUS | Status: DC | PRN
  Administered 2019-07-23: 30 mg via INTRAVENOUS

## 2019-07-23 MED ORDER — MIDAZOLAM HCL 2 MG/2ML IJ SOLN
INTRAMUSCULAR | Status: DC | PRN
  Administered 2019-07-23: 2 mg via INTRAVENOUS

## 2019-07-23 MED ORDER — SUCCINYLCHOLINE CHLORIDE 200 MG/10ML IV SOSY
PREFILLED_SYRINGE | INTRAVENOUS | Status: DC | PRN
  Administered 2019-07-23: 120 mg via INTRAVENOUS

## 2019-07-23 MED ORDER — METHADONE HCL 10 MG PO TABS
10.0000 mg | ORAL_TABLET | Freq: Once | ORAL | Status: AC
  Administered 2019-07-23: 10 mg via ORAL
  Filled 2019-07-23: qty 1

## 2019-07-23 MED ORDER — PROPOFOL 10 MG/ML IV BOLUS
INTRAVENOUS | Status: AC
  Filled 2019-07-23: qty 20

## 2019-07-23 SURGICAL SUPPLY — 24 items
CANISTER SUCT 3000ML PPV (MISCELLANEOUS) ×3 IMPLANT
CATH ROBINSON RED A/P 10FR (CATHETERS) IMPLANT
COAGULATOR SUCT 6 FR SWTCH (ELECTROSURGICAL)
COAGULATOR SUCT SWTCH 10FR 6 (ELECTROSURGICAL) IMPLANT
COVER WAND RF STERILE (DRAPES) ×3 IMPLANT
ELECT REM PT RETURN 9FT ADLT (ELECTROSURGICAL)
ELECT REM PT RETURN 9FT PED (ELECTROSURGICAL)
ELECTRODE REM PT RETRN 9FT PED (ELECTROSURGICAL) IMPLANT
ELECTRODE REM PT RTRN 9FT ADLT (ELECTROSURGICAL) IMPLANT
GAUZE 4X4 16PLY RFD (DISPOSABLE) ×3 IMPLANT
GLOVE ECLIPSE 7.5 STRL STRAW (GLOVE) ×3 IMPLANT
GOWN STRL REUS W/ TWL LRG LVL3 (GOWN DISPOSABLE) ×2 IMPLANT
GOWN STRL REUS W/TWL LRG LVL3 (GOWN DISPOSABLE) ×6
KIT BASIN OR (CUSTOM PROCEDURE TRAY) ×3 IMPLANT
KIT TURNOVER KIT B (KITS) ×3 IMPLANT
NS IRRIG 1000ML POUR BTL (IV SOLUTION) ×3 IMPLANT
PACK SURGICAL SETUP 50X90 (CUSTOM PROCEDURE TRAY) ×3 IMPLANT
SPONGE TONSIL TAPE 1 RFD (DISPOSABLE) ×3 IMPLANT
SYR BULB 3OZ (MISCELLANEOUS) ×3 IMPLANT
TOWEL GREEN STERILE FF (TOWEL DISPOSABLE) ×6 IMPLANT
TUBE CONNECTING 12'X1/4 (SUCTIONS) ×1
TUBE CONNECTING 12X1/4 (SUCTIONS) ×2 IMPLANT
TUBE SALEM SUMP 16 FR W/ARV (TUBING) ×3 IMPLANT
WAND COBLATOR 70 EVAC XTRA (SURGICAL WAND) ×3 IMPLANT

## 2019-07-23 NOTE — Progress Notes (Signed)
Pt states his 40 year old son is going to be staying with him.  Pt refuses admission -states "there is no one to stay with my son".  Dr. Mal Amabile and Dr. Suszanne Conners notified and pt signed AMA form.  Pt does have a cousin that is picking him up.  Advised pt that he could not drive.

## 2019-07-23 NOTE — Op Note (Signed)
DATE OF PROCEDURE:  07/23/2019                              OPERATIVE REPORT  SURGEON:  Newman Pies, MD  PREOPERATIVE DIAGNOSES: 1. Adenotonsillar hypertrophy. 2. Chronic tonsillitis and pharyngitis  POSTOPERATIVE DIAGNOSES: 1. Adenotonsillar hypertrophy. 2. Chronic tonsillitis and pharyngitis  PROCEDURE PERFORMED:  Adenotonsillectomy.  ANESTHESIA:  General endotracheal tube anesthesia.  COMPLICATIONS:  None.  ESTIMATED BLOOD LOSS:  Minimal.  INDICATION FOR PROCEDURE:  Joel Leblanc is a 39 y.o. male with a history of chronic tonsillitis/pharyngitis and halitosis.  According to the patient, he has been experiencing chronic throat discomfort with halitosis for several years. The patient continued to be symptomatic despite medical treatments. On examination, the patient was noted to have bilateral cryptic tonsils, with numerous tonsilloliths. Based on the above findings, the decision was made for the patient to undergo the adenotonsillectomy procedure. Likelihood of success in reducing symptoms was also discussed.  The risks, benefits, alternatives, and details of the procedure were discussed with the patient.  Questions were invited and answered.  Informed consent was obtained.  DESCRIPTION:  The patient was taken to the operating room and placed supine on the operating table.  General endotracheal tube anesthesia was administered by the anesthesiologist.  The patient was positioned and prepped and draped in a standard fashion for adenotonsillectomy.  A Crowe-Davis mouth gag was inserted into the oral cavity for exposure. 2+ cryptic tonsils were noted bilaterally.  No bifidity was noted.  Indirect mirror examination of the nasopharynx revealed mild adenoid hypertrophy. The adenoid was ablated with the Coblator device. Hemostasis was achieved with the Coblator device.  The right tonsil was then grasped with a straight Allis clamp and retracted medially.  It was resected free from the  underlying pharyngeal constrictor muscles with the Coblator device.  The same procedure was repeated on the left side without exception.  The surgical sites were copiously irrigated.  The mouth gag was removed.  The care of the patient was turned over to the anesthesiologist.  The patient was awakened from anesthesia without difficulty.  The patient was extubated and transferred to the recovery room in good condition.  OPERATIVE FINDINGS:  Adenotonsillar hypertrophy.  SPECIMEN:  Bilateral tonsils  FOLLOWUP CARE:  The patient will be discharged home once awake and alert.  He will be placed on amoxicillin 800 mg p.o. b.i.d. for 5 days.   The patient will follow up in my office in approximately 2 weeks.  Joel Leblanc 07/23/2019 10:35 AM

## 2019-07-23 NOTE — Discharge Instructions (Addendum)
SU WOOI TEOH M.D., P.A. °Postoperative Instructions for Tonsillectomy & Adenoidectomy (T&A) °Activity °Restrict activity at home for the first two days, resting as much as possible. Light indoor activity is best. You may usually return to school or work within a week but void strenuous activity and sports for two weeks. Sleep with your head elevated on 2-3 pillows for 3-4 days to help decrease swelling. °Diet °Due to tissue swelling and throat discomfort, you may have little desire to drink for several days. However fluids are very important to prevent dehydration. You will find that non-acidic juices, soups, popsicles, Jell-O, custard, puddings, and any soft or mashed foods taken in small quantities can be swallowed fairly easily. Try to increase your fluid and food intake as the discomfort subsides. It is recommended that a child receive 1-1/2 quarts of fluid in a 24-hour period. Adult require twice this amount.  °Discomfort °Your sore throat may be relieved by applying an ice collar to your neck and/or by taking Tylenol®. You may experience an earache, which is due to referred pain from the throat. Referred ear pain is commonly felt at night when trying to rest. ° °Bleeding                        Although rare, there is risk of having some bleeding during the first 2 weeks after having a T&A. This usually happens between days 7-10 postoperatively. If you or your child should have any bleeding, try to remain calm. We recommend sitting up quietly in a chair and gently spitting out the blood into a bowl. For adults, gargling gently with ice water may help. If the bleeding does not stop after a short time (5 minutes), is more than 1 teaspoonful, or if you become worried, please call our office at [phone] or go directly to the nearest hospital emergency room. Do not eat or drink anything prior to going to the hospital as you may need to be taken to the operating room in order to control the bleeding. °GENERAL  CONSIDERATIONS °1. Brush your teeth regularly. Avoid mouthwashes and gargles for three weeks. You may gargle gently with warm salt-water as necessary or spray with Chloraseptic®. You may make salt-water by placing 2 teaspoons of table salt into a quart of fresh water. Warm the salt-water in a microwave to a luke warm temperature.  °2. Avoid exposure to colds and upper respiratory infections if possible.  °3. If you look into a mirror or into your child's mouth, you will see white-gray patches in the back of the throat. This is normal after having a T&A and is like a scab that forms on the skin after an abrasion. It will disappear once the back of the throat heals completely. However, it may cause a noticeable odor; this too will disappear with time. Again, warm salt-water gargles may be used to help keep the throat clean and promote healing.  °4. You may notice a temporary change in voice quality, such as a higher pitched voice or a nasal sound, until healing is complete. This may last for 1-2 weeks and should resolve.  °5. Do not take or give you child any medications that we have not prescribed or recommended.  °6. Snoring may occur, especially at night, for the first week after a T&A. It is due to swelling of the soft palate and will usually resolve.  °Please call our office at [phone] if you have any questions.   °

## 2019-07-23 NOTE — Anesthesia Postprocedure Evaluation (Signed)
Anesthesia Post Note  Patient: Joel Leblanc  Procedure(s) Performed: TONSILLECTOMY AND ADENOIDECTOMY (N/A Mouth)     Patient location during evaluation: PACU Anesthesia Type: General Level of consciousness: awake and alert Pain management: pain level controlled Vital Signs Assessment: post-procedure vital signs reviewed and stable Respiratory status: spontaneous breathing, nonlabored ventilation and respiratory function stable Cardiovascular status: blood pressure returned to baseline and stable Postop Assessment: no apparent nausea or vomiting Anesthetic complications: no    Last Vitals:  Vitals:   07/23/19 1032 07/23/19 1045  BP:  120/78  Pulse:  82  Resp:  14  Temp:    SpO2: 94% 94%    Last Pain:  Vitals:   07/23/19 1045  PainSc: 1                  Beryle Lathe

## 2019-07-23 NOTE — H&P (Signed)
Cc: Chronic sore throat  HPI: The patient is a 39 y/o male who presents today for evaluation of chronic sore throat.  The patient has noted frequent episodes of tonsillitis for the past several years. The patient has taken multiple courses of antibiotics with minimal relief.The patient has noted tonsil stones in the past with associated halitosis. The patient's throat pain usually refers to his right ear as well. 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   The patient's review of systems (constitutional, eyes, ENT, cardiovascular, respiratory, GI, musculoskeletal, skin, neurologic, psychiatric, endocrine, hematologic, allergic) is noted in the ROS questionnaire.  It is reviewed with the patient.   Family health history: Diabetes.  Major events: None.  Ongoing medical problems: Irregular pulse, night sweats, asthma, reflux, headache, migraine, stroke, anemia, sickle cell disease.  Social history: The patient is single.He denies the use of tobacco, alcohol or illegal drugs.   Exam: General: Communicates without difficulty, well nourished, no acute distress. Head: Normocephalic, no evidence injury, no tenderness, facial buttresses intact without stepoff. Eyes: PERRL, EOMI. No scleral icterus, conjunctivae clear. Neuro: CN II exam reveals vision grossly intact.  No nystagmus at any point of gaze. Ears: Auricles well formed without lesions.  Ear canals are intact without mass or lesion.  No erythema or edema is appreciated.  The TMs are intact without fluid. Nose: External evaluation reveals normal support and skin without lesions.  Dorsum is intact.  Anterior rhinoscopy reveals healthy pink mucosa over anterior aspect of inferior turbinates and intact septum.  No purulence noted. Oral:  Oral cavity and oropharynx are intact, symmetric, without erythema or edema.  Mucosa is moist without lesions. Tonsils 2+, cryptic. Neck: Full range of motion without pain.  There is no  significant lymphadenopathy.  No masses palpable.  Thyroid bed within normal limits to palpation.  Parotid glands and submandibular glands equal bilaterally without mass.  Trachea is midline. Neuro:  CN 2-12 grossly intact. Gait normal. Vestibular: No nystagmus at any point of gaze. The cerebellar examination is unremarkable.   Assessment 1.  The patient's history and physical exam findings are consistent with chronic tonsillitis/pharyngitis secondary to tonsillar hypertrophy.  Plan  1. The treatment options include continuing conservative observation versus adenotonsillectomy.  Based on the patient's history and physical exam findings, the patient will likely benefit from having the tonsils and possibly adenoid removed.  The risks, benefits, alternatives, and details of the procedure are reviewed with the patient.  Questions are invited and answered.  2. The patient is interested in proceeding with the procedure.  We will schedule the procedure in accordance with the family schedule.

## 2019-07-23 NOTE — Transfer of Care (Signed)
Immediate Anesthesia Transfer of Care Note  Patient: [facility]  Procedure(s) Performed: TONSILLECTOMY AND ADENOIDECTOMY (N/A Mouth)  Patient Location: PACU  Anesthesia Type:General  Level of Consciousness: awake, alert  and patient cooperative  Airway & Oxygen Therapy: Patient Spontanous Breathing  Post-op Assessment: Report given to RN and Post -op Vital signs reviewed and stable  Post vital signs: Reviewed and stable  Last Vitals:  Vitals Value Taken Time  BP 123/80 07/23/19 1030  Temp    Pulse 86 07/23/19 1030  Resp 22 07/23/19 1030  SpO2 91 % 07/23/19 1030  Vitals shown include unvalidated device data.  Last Pain:  Vitals:   07/23/19 0804  PainSc: 5       Patients Stated Pain Goal: 2 (07/23/19 0804)  Complications: No apparent anesthesia complications

## 2019-07-23 NOTE — Anesthesia Procedure Notes (Signed)
Procedure Name: Intubation Date/Time: 07/23/2019 9:52 AM Performed by: Janace Litten, CRNA Pre-anesthesia Checklist: Patient identified, Emergency Drugs available, Suction available and Patient being monitored Patient Re-evaluated:Patient Re-evaluated prior to induction Oxygen Delivery Method: Circle System Utilized Preoxygenation: Pre-oxygenation with 100% oxygen Induction Type: IV induction and Rapid sequence Ventilation: Mask ventilation without difficulty Laryngoscope Size: Mac and 4 Grade View: Grade I Tube type: Oral Tube size: 7.5 mm Number of attempts: 1 Airway Equipment and Method: Stylet Placement Confirmation: ETT inserted through vocal cords under direct vision,  positive ETCO2 and breath sounds checked- equal and bilateral Secured at: 24 cm Tube secured with: Tape Dental Injury: Teeth and Oropharynx as per pre-operative assessment

## 2019-07-23 NOTE — Anesthesia Preprocedure Evaluation (Addendum)
Anesthesia Evaluation  Patient identified by MRN, date of birth, ID band Patient awake    Reviewed: Allergy & Precautions, NPO status , Patient's Chart, lab work & pertinent test results  History of Anesthesia Complications Negative for: history of anesthetic complications  Airway Mallampati: II  TM Distance: >3 FB Neck ROM: Full    Dental  (+) Dental Advisory Given, Teeth Intact   Pulmonary asthma , sleep apnea ,   Hx tracheostomy 10 years ago, decannulated after 3 months     Pulmonary exam normal        Cardiovascular negative cardio ROS Normal cardiovascular exam     Neuro/Psych  Headaches, PSYCHIATRIC DISORDERS Depression CVA (2011), Residual Symptoms    GI/Hepatic GERD  Medicated and Controlled,(+)     substance abuse  marijuana use,   Endo/Other  negative endocrine ROS  Renal/GU Renal disease (dialysis in 2011 x 2 months)     Musculoskeletal negative musculoskeletal ROS (+)  Chronic pain syndrome    Abdominal   Peds  (+) ATTENTION DEFICIT DISORDER WITHOUT HYPERACTIVITY Hematology  (+) Sickle cell anemia ,   Anesthesia Other Findings Adenotonsillar hypertrophy Covid neg 07/21/19   Reproductive/Obstetrics                            Anesthesia Physical Anesthesia Plan  ASA: III  Anesthesia Plan: General   Post-op Pain Management:    Induction: Intravenous  PONV Risk Score and Plan: 3 and Treatment may vary due to age or medical condition, Ondansetron, Dexamethasone and Midazolam  Airway Management Planned: Oral ETT  Additional Equipment: None  Intra-op Plan:   Post-operative Plan: Extubation in OR  Informed Consent: I have reviewed the patients History and Physical, chart, labs and discussed the procedure including the risks, benefits and alternatives for the proposed anesthesia with the patient or authorized representative who has indicated his/her understanding  and acceptance.     Dental advisory given  Plan Discussed with: CRNA and Anesthesiologist  Anesthesia Plan Comments:        Anesthesia Quick Evaluation

## 2019-07-24 LAB — SURGICAL PATHOLOGY

## 2019-07-31 ENCOUNTER — Encounter: Payer: Self-pay | Admitting: Emergency Medicine

## 2019-07-31 ENCOUNTER — Emergency Department
Admission: EM | Admit: 2019-07-31 | Discharge: 2019-07-31 | Disposition: A | Discharge status: Home / Self Care | Payer: Medicare Other | Attending: Emergency Medicine | Admitting: Emergency Medicine

## 2019-07-31 ENCOUNTER — Other Ambulatory Visit: Payer: Self-pay

## 2019-07-31 DIAGNOSIS — G8929 Other chronic pain: Secondary | ICD-10-CM | POA: Diagnosis not present

## 2019-07-31 DIAGNOSIS — Z91040 Latex allergy status: Secondary | ICD-10-CM | POA: Insufficient documentation

## 2019-07-31 DIAGNOSIS — T7840XA Allergy, unspecified, initial encounter: Secondary | ICD-10-CM | POA: Diagnosis not present

## 2019-07-31 DIAGNOSIS — J45909 Unspecified asthma, uncomplicated: Secondary | ICD-10-CM | POA: Diagnosis not present

## 2019-07-31 DIAGNOSIS — Z79899 Other long term (current) drug therapy: Secondary | ICD-10-CM | POA: Diagnosis not present

## 2019-07-31 DIAGNOSIS — R220 Localized swelling, mass and lump, head: Secondary | ICD-10-CM | POA: Diagnosis present

## 2019-07-31 MED ORDER — PREDNISONE 5 MG/ML PO CONC
60.0000 mg | ORAL | Status: DC
  Filled 2019-07-31: qty 12

## 2019-07-31 MED ORDER — DIPHENHYDRAMINE HCL 12.5 MG/5ML PO LIQD: 50.0000 mg | Freq: Four times a day (QID) | ORAL | 0 refills | Status: AC

## 2019-07-31 MED ORDER — PREDNISOLONE SODIUM PHOSPHATE 15 MG/5ML PO SOLN: 60.0000 mg | Freq: Every day | ORAL | 0 refills | Status: AC

## 2019-07-31 MED ORDER — DIPHENHYDRAMINE HCL 12.5 MG/5ML PO ELIX
50.0000 mg | ORAL_SOLUTION | ORAL | Status: DC
  Filled 2019-07-31: qty 20

## 2019-07-31 MED ORDER — DIPHENHYDRAMINE HCL 50 MG/ML IJ SOLN
50.0000 mg | Freq: Once | INTRAMUSCULAR | Status: AC
  Administered 2019-07-31: 21:00:00 50 mg via INTRAMUSCULAR
  Filled 2019-07-31: qty 1

## 2019-07-31 MED ORDER — PREDNISONE 5 MG/ML PO CONC: 60.0000 mg | Freq: Every day | ORAL | Status: DC

## 2019-07-31 MED ORDER — PREDNISOLONE SODIUM PHOSPHATE 15 MG/5ML PO SOLN: 60.0000 mg | Freq: Once | ORAL | Status: DC

## 2019-07-31 MED ORDER — METHYLPREDNISOLONE SODIUM SUCC 125 MG IJ SOLR
125.0000 mg | Freq: Once | INTRAMUSCULAR | Status: AC
  Administered 2019-07-31: 125 mg via INTRAMUSCULAR
  Filled 2019-07-31: qty 2

## 2019-07-31 NOTE — ED Notes (Signed)
Patient discharged to home per MD order. Patient in stable condition, and deemed medically cleared by ED provider for discharge. Discharge instructions reviewed with patient/family using "Teach Back"; verbalized understanding of medication education and administration, and information about follow-up care. Denies further concerns. ° °
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
## 2019-07-31 NOTE — ED Notes (Signed)
Patient started writhing around in pain a few minutes after the solumedrol shot, says he has sickle cell and it triggered a crisis. EDP notified, applied heat to area. EDP coming to see patient shortly

## 2019-07-31 NOTE — ED Triage Notes (Signed)
Pt to ED from home c/o allergic reaction, tongue swelling with difficulty swallowing.  Patient had tonsils removed 3 days ago and placed on amoxicillin which is the only new medication pt is taking.  States noticed yesterday but related it to surgery and then swelling became worse today, told by his sickle cell doctor to come in.  Pt A&Ox4, chest rise even and unlabored, denies difficulty breathing at this time.

## 2019-08-01 ENCOUNTER — Telehealth: Payer: Self-pay | Admitting: Family Medicine

## 2019-08-01 NOTE — Telephone Encounter (Signed)
  [facility]   Corin Golomb, a 39 year old male with a known history of sickle cell disease, chronic pain syndrome, opiate dependence and tolerance and history of bipolar disorder called office for medication advice concerning tongue swelling, excessive bleeding when brushing his teeth, and mild discomfort after starting amoxicillin.  Patient had a procedure performed several days ago and was started on amoxicillin.  He states that symptoms began shortly after taking medication.  Patient advised to report to the nearest emergency department immediately for possible allergic reaction.  Patient expressed understanding.   Nolon Nations  APRN, MSN, FNP-C [facility] [address] [place], [place] [address] [phone]

## 2019-10-07 ENCOUNTER — Encounter: Payer: Self-pay | Admitting: Family Medicine

## 2019-10-07 ENCOUNTER — Ambulatory Visit (INDEPENDENT_AMBULATORY_CARE_PROVIDER_SITE_OTHER): Payer: Medicare Other | Admitting: Family Medicine

## 2019-10-07 ENCOUNTER — Other Ambulatory Visit: Payer: Self-pay

## 2019-10-07 VITALS — BP 131/89 | HR 82 | Temp 98.1°F | Ht 72.0 in | Wt 236.6 lb

## 2019-10-07 DIAGNOSIS — N529 Male erectile dysfunction, unspecified: Secondary | ICD-10-CM

## 2019-10-07 DIAGNOSIS — F331 Major depressive disorder, recurrent, moderate: Secondary | ICD-10-CM | POA: Diagnosis not present

## 2019-10-07 DIAGNOSIS — Z9109 Other allergy status, other than to drugs and biological substances: Secondary | ICD-10-CM

## 2019-10-07 DIAGNOSIS — D5720 Sickle-cell/Hb-C disease without crisis: Secondary | ICD-10-CM

## 2019-10-07 DIAGNOSIS — F988 Other specified behavioral and emotional disorders with onset usually occurring in childhood and adolescence: Secondary | ICD-10-CM | POA: Diagnosis not present

## 2019-10-07 LAB — POCT URINALYSIS DIPSTICK
Bilirubin, UA: NEGATIVE
Blood, UA: NEGATIVE
Glucose, UA: NEGATIVE
Ketones, UA: NEGATIVE
Leukocytes, UA: NEGATIVE
Nitrite, UA: NEGATIVE
Protein, UA: NEGATIVE
Spec Grav, UA: 1.02 (ref 1.010–1.025)
Urobilinogen, UA: 0.2 E.U./dL
pH, UA: 7 (ref 5.0–8.0)

## 2019-10-07 MED ORDER — CETIRIZINE HCL 10 MG PO TABS: 10.0000 mg | ORAL_TABLET | Freq: Every day | ORAL | 11 refills | Status: AC
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
## 2019-10-08 ENCOUNTER — Telehealth: Payer: Self-pay

## 2019-10-08 LAB — CBC WITH DIFFERENTIAL/PLATELET
Basophils Absolute: 0.1 10*3/uL (ref 0.0–0.2)
Basos: 1 %
EOS (ABSOLUTE): 0.2 10*3/uL (ref 0.0–0.4)
Eos: 1 %
Hematocrit: 36.6 % — ABNORMAL LOW (ref 37.5–51.0)
Hemoglobin: 11.8 g/dL — ABNORMAL LOW (ref 13.0–17.7)
Immature Grans (Abs): 0 10*3/uL (ref 0.0–0.1)
Immature Granulocytes: 0 %
Lymphocytes Absolute: 2.9 10*3/uL (ref 0.7–3.1)
Lymphs: 25 %
MCH: 25.5 pg — ABNORMAL LOW (ref 26.6–33.0)
MCHC: 32.2 g/dL (ref 31.5–35.7)
MCV: 79 fL (ref 79–97)
Monocytes Absolute: 0.9 10*3/uL (ref 0.1–0.9)
Monocytes: 7 %
NRBC: 1 % — ABNORMAL HIGH (ref 0–0)
Neutrophils Absolute: 7.8 10*3/uL — ABNORMAL HIGH (ref 1.4–7.0)
Neutrophils: 66 %
Platelets: 459 10*3/uL — ABNORMAL HIGH (ref 150–450)
RBC: 4.62 x10E6/uL (ref 4.14–5.80)
RDW: 16.5 % — ABNORMAL HIGH (ref 11.6–15.4)
WBC: 11.9 10*3/uL — ABNORMAL HIGH (ref 3.4–10.8)

## 2019-10-08 LAB — CMP AND LIVER
ALT: 25 IU/L (ref 0–44)
AST: 32 IU/L (ref 0–40)
Albumin: 4.4 g/dL (ref 4.0–5.0)
Alkaline Phosphatase: 85 IU/L (ref 48–121)
BUN: 11 mg/dL (ref 6–24)
Bilirubin Total: 0.8 mg/dL (ref 0.0–1.2)
Bilirubin, Direct: 0.24 mg/dL (ref 0.00–0.40)
CO2: 21 mmol/L (ref 20–29)
Calcium: 9.9 mg/dL (ref 8.7–10.2)
Chloride: 103 mmol/L (ref 96–106)
Creatinine, Ser: 0.82 mg/dL (ref 0.76–1.27)
GFR calc Af Amer: 128 mL/min/{1.73_m2} (ref >59)
GFR calc non Af Amer: 111 mL/min/{1.73_m2} (ref >59)
Glucose: 91 mg/dL (ref 65–99)
Potassium: 4.4 mmol/L (ref 3.5–5.2)
Sodium: 141 mmol/L (ref 134–144)
Total Protein: 7.4 g/dL (ref 6.0–8.5)

## 2019-10-08 LAB — TESTOSTERONE: Testosterone: 581 ng/dL (ref 264–916)

## 2019-10-08 LAB — RETICULOCYTES: Retic Ct Pct: 4.9 % — ABNORMAL HIGH (ref 0.6–2.6)

## 2019-10-08 NOTE — Telephone Encounter (Signed)
-----   Message from Lachina M Hollis, FNP sent at 10/08/2019 11:17 AM EDT ----- Regarding: lab results Hemoglobin is 11.9, which is consistent with his baseline. Testosterone level is within a normal range.  Discuss the importance of hydrating with 64 ounces of fluids per day.  Follow up with hematology and pain management as scheduled.    Lachina Moore Hollis  APRN, MSN, FNP-C [facility] [address]  [place], [place] [address] [phone]   

## 2019-10-08 NOTE — Telephone Encounter (Signed)
Called spoke with pt about lab results , reminded patient of follow up appt with specialists and to stay will hydrated.

## 2019-10-08 NOTE — Telephone Encounter (Signed)
-----   Message from Massie Maroon, [place] sent at 10/08/2019 11:17 AM EDT ----- Regarding: lab results Hemoglobin is 11.9, which is consistent with his baseline. Testosterone level is within a normal range.  Discuss the importance of hydrating with 64 ounces of fluids per day.  Follow up with hematology and pain management as scheduled.    Nolon Nations  APRN, MSN, FNP-C [facility] [address] [place], [place] [address] [phone]

## 2019-10-08 NOTE — Telephone Encounter (Signed)
Called and lvm for patient to call us back. 

## 2019-12-02 ENCOUNTER — Other Ambulatory Visit: Payer: Self-pay | Admitting: Family Medicine

## 2019-12-02 DIAGNOSIS — F988 Other specified behavioral and emotional disorders with onset usually occurring in childhood and adolescence: Secondary | ICD-10-CM

## 2019-12-02 DIAGNOSIS — F319 Bipolar disorder, unspecified: Secondary | ICD-10-CM

## 2019-12-02 NOTE — Progress Notes (Signed)
Orders Placed This Encounter  Procedures   Ambulatory referral to Psychiatry    Referral Priority:   Urgent    Referral Type:   Psychiatric    Referral Reason:   Specialty Services Required    Requested Specialty:   Psychiatry    Number of Visits Requested:   1     Joel Gortney Rennis Petty  APRN, MSN, FNP-C [facility] [address] [place], [place] [address] [phone]

## 2020-02-03 ENCOUNTER — Telehealth (INDEPENDENT_AMBULATORY_CARE_PROVIDER_SITE_OTHER): Payer: Medicare Other | Admitting: Family Medicine

## 2020-02-03 DIAGNOSIS — D5720 Sickle-cell/Hb-C disease without crisis: Secondary | ICD-10-CM

## 2020-02-03 NOTE — Progress Notes (Signed)
Patient canceled

## 2020-05-06 ENCOUNTER — Other Ambulatory Visit: Payer: Self-pay

## 2020-05-06 ENCOUNTER — Ambulatory Visit
Admission: RE | Admit: 2020-05-06 | Discharge: 2020-05-06 | Disposition: A | Discharge status: Home / Self Care | Payer: Medicare Other | Source: Ambulatory Visit | Attending: Family Medicine | Admitting: Family Medicine

## 2020-05-11 ENCOUNTER — Encounter (HOSPITAL_COMMUNITY): Payer: Self-pay

## 2020-05-11 ENCOUNTER — Inpatient Hospital Stay (HOSPITAL_COMMUNITY)
Admission: EM | Admit: 2020-05-11 | Discharge: 2020-05-16 | DRG: 175 | Disposition: A | Discharge status: Home / Self Care | Payer: Medicare Other | Attending: Internal Medicine | Admitting: Internal Medicine

## 2020-05-11 ENCOUNTER — Emergency Department (HOSPITAL_COMMUNITY): Payer: Medicare Other

## 2020-05-11 ENCOUNTER — Other Ambulatory Visit: Payer: Self-pay

## 2020-05-11 DIAGNOSIS — G894 Chronic pain syndrome: Secondary | ICD-10-CM | POA: Diagnosis present

## 2020-05-11 DIAGNOSIS — G4730 Sleep apnea, unspecified: Secondary | ICD-10-CM | POA: Diagnosis present

## 2020-05-11 DIAGNOSIS — K219 Gastro-esophageal reflux disease without esophagitis: Secondary | ICD-10-CM | POA: Diagnosis present

## 2020-05-11 DIAGNOSIS — Z91040 Latex allergy status: Secondary | ICD-10-CM | POA: Diagnosis not present

## 2020-05-11 DIAGNOSIS — D72829 Elevated white blood cell count, unspecified: Secondary | ICD-10-CM

## 2020-05-11 DIAGNOSIS — Z885 Allergy status to narcotic agent status: Secondary | ICD-10-CM | POA: Diagnosis not present

## 2020-05-11 DIAGNOSIS — M21371 Foot drop, right foot: Secondary | ICD-10-CM | POA: Diagnosis present

## 2020-05-11 DIAGNOSIS — J9611 Chronic respiratory failure with hypoxia: Secondary | ICD-10-CM | POA: Diagnosis present

## 2020-05-11 DIAGNOSIS — J4520 Mild intermittent asthma, uncomplicated: Secondary | ICD-10-CM | POA: Diagnosis present

## 2020-05-11 DIAGNOSIS — Z888 Allergy status to other drugs, medicaments and biological substances status: Secondary | ICD-10-CM

## 2020-05-11 DIAGNOSIS — D57219 Sickle-cell/Hb-C disease with crisis, unspecified: Secondary | ICD-10-CM | POA: Diagnosis present

## 2020-05-11 DIAGNOSIS — Z79899 Other long term (current) drug therapy: Secondary | ICD-10-CM

## 2020-05-11 DIAGNOSIS — Z791 Long term (current) use of non-steroidal anti-inflammatories (NSAID): Secondary | ICD-10-CM | POA: Diagnosis not present

## 2020-05-11 DIAGNOSIS — Z7951 Long term (current) use of inhaled steroids: Secondary | ICD-10-CM | POA: Diagnosis not present

## 2020-05-11 DIAGNOSIS — I2609 Other pulmonary embolism with acute cor pulmonale: Principal | ICD-10-CM

## 2020-05-11 DIAGNOSIS — Z20822 Contact with and (suspected) exposure to covid-19: Secondary | ICD-10-CM | POA: Diagnosis present

## 2020-05-11 DIAGNOSIS — F1190 Opioid use, unspecified, uncomplicated: Secondary | ICD-10-CM | POA: Diagnosis present

## 2020-05-11 DIAGNOSIS — I2694 Multiple subsegmental pulmonary emboli without acute cor pulmonale: Secondary | ICD-10-CM

## 2020-05-11 DIAGNOSIS — F331 Major depressive disorder, recurrent, moderate: Secondary | ICD-10-CM | POA: Diagnosis present

## 2020-05-11 DIAGNOSIS — I2699 Other pulmonary embolism without acute cor pulmonale: Secondary | ICD-10-CM | POA: Diagnosis not present

## 2020-05-11 DIAGNOSIS — Z880 Allergy status to penicillin: Secondary | ICD-10-CM | POA: Diagnosis not present

## 2020-05-11 DIAGNOSIS — Z833 Family history of diabetes mellitus: Secondary | ICD-10-CM

## 2020-05-11 DIAGNOSIS — D5700 Hb-SS disease with crisis, unspecified: Secondary | ICD-10-CM | POA: Diagnosis present

## 2020-05-11 DIAGNOSIS — I2601 Septic pulmonary embolism with acute cor pulmonale: Secondary | ICD-10-CM | POA: Diagnosis not present

## 2020-05-11 LAB — RESP PANEL BY RT-PCR (FLU A&B, COVID) ARPGX2
Influenza A by PCR: NEGATIVE
Influenza B by PCR: NEGATIVE
SARS Coronavirus 2 by RT PCR: NEGATIVE

## 2020-05-11 LAB — RETICULOCYTES
Immature Retic Fract: 41 % — ABNORMAL HIGH (ref 2.3–15.9)
RBC.: 4.31 MIL/uL (ref 4.22–5.81)
Retic Count, Absolute: 484 10*3/uL — ABNORMAL HIGH (ref 19.0–186.0)
Retic Ct Pct: 11.2 % — ABNORMAL HIGH (ref 0.4–3.1)

## 2020-05-11 LAB — CBC WITH DIFFERENTIAL/PLATELET
Abs Immature Granulocytes: 0.19 10*3/uL — ABNORMAL HIGH (ref 0.00–0.07)
Basophils Absolute: 0.1 10*3/uL (ref 0.0–0.1)
Basophils Relative: 1 %
Eosinophils Absolute: 0.2 10*3/uL (ref 0.0–0.5)
Eosinophils Relative: 1 %
HCT: 32.2 % — ABNORMAL LOW (ref 39.0–52.0)
Hemoglobin: 11.6 g/dL — ABNORMAL LOW (ref 13.0–17.0)
Immature Granulocytes: 1 %
Lymphocytes Relative: 21 %
Lymphs Abs: 4.1 10*3/uL — ABNORMAL HIGH (ref 0.7–4.0)
MCH: 27.1 pg (ref 26.0–34.0)
MCHC: 36 g/dL (ref 30.0–36.0)
MCV: 75.2 fL — ABNORMAL LOW (ref 80.0–100.0)
Monocytes Absolute: 1.7 10*3/uL — ABNORMAL HIGH (ref 0.1–1.0)
Monocytes Relative: 9 %
Neutro Abs: 13.1 10*3/uL — ABNORMAL HIGH (ref 1.7–7.7)
Neutrophils Relative %: 67 %
Platelets: 289 10*3/uL (ref 150–400)
RBC: 4.28 MIL/uL (ref 4.22–5.81)
RDW: 20.6 % — ABNORMAL HIGH (ref 11.5–15.5)
WBC: 19.3 10*3/uL — ABNORMAL HIGH (ref 4.0–10.5)
nRBC: 4 % — ABNORMAL HIGH (ref 0.0–0.2)

## 2020-05-11 LAB — TROPONIN I (HIGH SENSITIVITY)
Troponin I (High Sensitivity): 88 ng/L — ABNORMAL HIGH (ref <18)
Troponin I (High Sensitivity): 91 ng/L — ABNORMAL HIGH (ref <18)

## 2020-05-11 LAB — COMPREHENSIVE METABOLIC PANEL
ALT: 53 U/L — ABNORMAL HIGH (ref 0–44)
AST: 50 U/L — ABNORMAL HIGH (ref 15–41)
Albumin: 4.3 g/dL (ref 3.5–5.0)
Alkaline Phosphatase: 68 U/L (ref 38–126)
Anion gap: 13 (ref 5–15)
BUN: 12 mg/dL (ref 6–20)
CO2: 19 mmol/L — ABNORMAL LOW (ref 22–32)
Calcium: 9.2 mg/dL (ref 8.9–10.3)
Chloride: 106 mmol/L (ref 98–111)
Creatinine, Ser: 1.19 mg/dL (ref 0.61–1.24)
GFR, Estimated: >60 mL/min (ref >60)
Glucose, Bld: 143 mg/dL — ABNORMAL HIGH (ref 70–99)
Potassium: 3.8 mmol/L (ref 3.5–5.1)
Sodium: 138 mmol/L (ref 135–145)
Total Bilirubin: 3.8 mg/dL — ABNORMAL HIGH (ref 0.3–1.2)
Total Protein: 7.8 g/dL (ref 6.5–8.1)

## 2020-05-11 MED ORDER — HEPARIN (PORCINE) 25000 UT/250ML-% IV SOLN
1800.0000 [IU]/h | INTRAVENOUS | Status: DC
  Administered 2020-05-11 – 2020-05-13 (×4): 1800 [IU]/h via INTRAVENOUS
  Filled 2020-05-11 (×5): qty 250

## 2020-05-11 MED ORDER — HEPARIN BOLUS VIA INFUSION
3000.0000 [IU] | Freq: Once | INTRAVENOUS | Status: AC
  Administered 2020-05-11: 3000 [IU] via INTRAVENOUS
  Filled 2020-05-11: qty 3000

## 2020-05-11 MED ORDER — IOHEXOL 350 MG/ML SOLN
100.0000 mL | Freq: Once | INTRAVENOUS | Status: AC | PRN
  Administered 2020-05-11: 100 mL via INTRAVENOUS

## 2020-05-11 NOTE — ED Triage Notes (Signed)
Patient arrived stating he had a syncopal episode last week and was hold he had an abnormal EKG. Reports he was having central chest pain and shortness of breath before but has worsened over the week. Also complaints of bilateral leg pain related to [facility], last dose of home meds this afternoon but reports he stopped taking them because they were not working.
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
## 2020-05-11 NOTE — Progress Notes (Addendum)
ANTICOAGULATION CONSULT NOTE - Initial Consult  Pharmacy Consult for Heparin Indication: pulmonary embolus  Allergies  Allergen Reactions  . Amoxicillin Swelling  . Hydroxyurea Hives  . Tape Itching    Adhesive Tape  . Latex Rash  . Morphine And Related Nausea And Vomiting and Rash    Patient Measurements: Height: 6' (182.9 cm) Weight: 111.1 kg (245 lb) IBW/kg (Calculated) : 77.6 Heparin Dosing Weight: 101 kg  Vital Signs: Temp: 97.9 F (36.6 C) (01/04 2253) Temp Source: Oral (01/04 2253) BP: 125/77 (01/04 2253) Pulse Rate: 102 (01/04 2253)  Labs: Recent Labs    05/11/20 2032  HGB 11.6*  HCT 32.2*  PLT 289  CREATININE 1.19  TROPONINIHS 88*    Estimated Creatinine Clearance: 106.2 mL/min (by C-G formula based on SCr of 1.19 mg/dL).   Medical History: Past Medical History:  Diagnosis Date  . Asthma   . GERD (gastroesophageal reflux disease)   . Headache   . Sickle cell anemia (HCC)   . Sleep apnea    n cpap    Medications:  No oral anticoagulation PTA  Assessment:  40 yr male presents to ED with c/o sickle cell pain crisis, chest pain and shortness of breath  PMH significant for sickle cell anemia, asthma, VTE 2015  CTAngio = + bilateral PE with associated right heart strain  Pharmacy consulted to dose IV heparin  Goal of Therapy:  Heparin level 0.3-0.7 units/ml Monitor platelets by anticoagulation protocol: Yes   Plan:   Heparin 3000 units IV bolus x 1 followed by heparin gtt @ 1800 units/hr  Check heparin level 6 hr after heparin gtt started  Follow heparin level and CBC daily while on IV heparin  Monitor for signs/symptoms of bleeding  Ann Bohne, Joselyn Glassman, PharmD 05/11/2020,10:58 PM   ADDENDUM   Heparin level = 0.57 (therapeutic) with heparin gtt @ 1800 units/hr  Hgb 28.6  Pltc 261 K  No complications of therapy noted  Plan for dopplers to r/o DVT (swelling noted in right foot and leg)  PLAN:  Continue IV heparin gtt @  1800 units/hr  Check heparin level in 6 hr confirm therapeutic dose  Follow heparin level and CBC daily while on IV heparin  Monitor for signs/symptoms of bleeding  Terrilee Files, PharmD 05/12/20 @ 05:47

## 2020-05-12 ENCOUNTER — Encounter (HOSPITAL_COMMUNITY): Payer: Self-pay | Admitting: Internal Medicine

## 2020-05-12 ENCOUNTER — Inpatient Hospital Stay (HOSPITAL_COMMUNITY): Payer: Medicare Other

## 2020-05-12 DIAGNOSIS — I2609 Other pulmonary embolism with acute cor pulmonale: Secondary | ICD-10-CM | POA: Diagnosis not present

## 2020-05-12 DIAGNOSIS — I2699 Other pulmonary embolism without acute cor pulmonale: Secondary | ICD-10-CM | POA: Diagnosis not present

## 2020-05-12 DIAGNOSIS — I2694 Multiple subsegmental pulmonary emboli without acute cor pulmonale: Secondary | ICD-10-CM

## 2020-05-12 DIAGNOSIS — D5700 Hb-SS disease with crisis, unspecified: Secondary | ICD-10-CM | POA: Diagnosis not present

## 2020-05-12 LAB — CBC WITH DIFFERENTIAL/PLATELET
Abs Immature Granulocytes: 0.21 10*3/uL — ABNORMAL HIGH (ref 0.00–0.07)
Basophils Absolute: 0.1 10*3/uL (ref 0.0–0.1)
Basophils Relative: 1 %
Eosinophils Absolute: 0.5 10*3/uL (ref 0.0–0.5)
Eosinophils Relative: 3 %
HCT: 28.6 % — ABNORMAL LOW (ref 39.0–52.0)
Hemoglobin: 10.2 g/dL — ABNORMAL LOW (ref 13.0–17.0)
Immature Granulocytes: 1 %
Lymphocytes Relative: 35 %
Lymphs Abs: 6.7 10*3/uL — ABNORMAL HIGH (ref 0.7–4.0)
MCH: 27 pg (ref 26.0–34.0)
MCHC: 35.7 g/dL (ref 30.0–36.0)
MCV: 75.7 fL — ABNORMAL LOW (ref 80.0–100.0)
Monocytes Absolute: 1.6 10*3/uL — ABNORMAL HIGH (ref 0.1–1.0)
Monocytes Relative: 8 %
Neutro Abs: 10.2 10*3/uL — ABNORMAL HIGH (ref 1.7–7.7)
Neutrophils Relative %: 52 %
Platelets: 261 10*3/uL (ref 150–400)
RBC: 3.78 MIL/uL — ABNORMAL LOW (ref 4.22–5.81)
RDW: 19.8 % — ABNORMAL HIGH (ref 11.5–15.5)
WBC: 19.3 10*3/uL — ABNORMAL HIGH (ref 4.0–10.5)
nRBC: 5.2 % — ABNORMAL HIGH (ref 0.0–0.2)

## 2020-05-12 LAB — TROPONIN I (HIGH SENSITIVITY)
Troponin I (High Sensitivity): 88 ng/L — ABNORMAL HIGH (ref <18)
Troponin I (High Sensitivity): 90 ng/L — ABNORMAL HIGH (ref <18)

## 2020-05-12 LAB — COMPREHENSIVE METABOLIC PANEL
ALT: 55 U/L — ABNORMAL HIGH (ref 0–44)
AST: 53 U/L — ABNORMAL HIGH (ref 15–41)
Albumin: 3.7 g/dL (ref 3.5–5.0)
Alkaline Phosphatase: 58 U/L (ref 38–126)
Anion gap: 11 (ref 5–15)
BUN: 13 mg/dL (ref 6–20)
CO2: 19 mmol/L — ABNORMAL LOW (ref 22–32)
Calcium: 8.8 mg/dL — ABNORMAL LOW (ref 8.9–10.3)
Chloride: 109 mmol/L (ref 98–111)
Creatinine, Ser: 1.16 mg/dL (ref 0.61–1.24)
GFR, Estimated: >60 mL/min (ref >60)
Glucose, Bld: 135 mg/dL — ABNORMAL HIGH (ref 70–99)
Potassium: 3.7 mmol/L (ref 3.5–5.1)
Sodium: 139 mmol/L (ref 135–145)
Total Bilirubin: 3.6 mg/dL — ABNORMAL HIGH (ref 0.3–1.2)
Total Protein: 6.8 g/dL (ref 6.5–8.1)

## 2020-05-12 LAB — ECHOCARDIOGRAM COMPLETE
Area-P 1/2: 9.85 cm2
Height: 72 in
S' Lateral: 2.8 cm
Weight: 3943.59 oz

## 2020-05-12 LAB — HEPARIN LEVEL (UNFRACTIONATED)
Heparin Unfractionated: 0.57 IU/mL (ref 0.30–0.70)
Heparin Unfractionated: 0.68 IU/mL (ref 0.30–0.70)
Heparin Unfractionated: 0.56 IU/mL (ref 0.30–0.70)

## 2020-05-12 LAB — LACTIC ACID, PLASMA
Lactic Acid, Venous: 1.8 mmol/L (ref 0.5–1.9)
Lactic Acid, Venous: 1.5 mmol/L (ref 0.5–1.9)

## 2020-05-12 LAB — HIV ANTIBODY (ROUTINE TESTING W REFLEX): HIV Screen 4th Generation wRfx: NONREACTIVE

## 2020-05-12 LAB — MRSA PCR SCREENING: MRSA by PCR: NEGATIVE

## 2020-05-12 MED ORDER — ONDANSETRON HCL 4 MG/2ML IJ SOLN
4.0000 mg | Freq: Four times a day (QID) | INTRAMUSCULAR | Status: DC | PRN
  Administered 2020-05-12 – 2020-05-16 (×5): 4 mg via INTRAVENOUS
  Filled 2020-05-12 (×5): qty 2

## 2020-05-12 MED ORDER — RIMEGEPANT SULFATE 75 MG PO TBDP: 1.0000 | ORAL_TABLET | Freq: Every day | ORAL | Status: DC | PRN

## 2020-05-12 MED ORDER — CYCLOBENZAPRINE HCL 10 MG PO TABS
10.0000 mg | ORAL_TABLET | Freq: Three times a day (TID) | ORAL | Status: DC
  Administered 2020-05-12 – 2020-05-16 (×10): 10 mg via ORAL
  Filled 2020-05-12 (×11): qty 1

## 2020-05-12 MED ORDER — CHLORHEXIDINE GLUCONATE CLOTH 2 % EX PADS
6.0000 | MEDICATED_PAD | Freq: Every day | CUTANEOUS | Status: DC
  Administered 2020-05-15: 6 via TOPICAL

## 2020-05-12 MED ORDER — MOMETASONE FURO-FORMOTEROL FUM 200-5 MCG/ACT IN AERO
2.0000 | INHALATION_SPRAY | Freq: Two times a day (BID) | RESPIRATORY_TRACT | Status: DC
  Administered 2020-05-12 – 2020-05-16 (×9): 2 via RESPIRATORY_TRACT
  Filled 2020-05-12: qty 8.8

## 2020-05-12 MED ORDER — LORATADINE 10 MG PO TABS
10.0000 mg | ORAL_TABLET | Freq: Every day | ORAL | Status: DC
  Administered 2020-05-12 – 2020-05-16 (×5): 10 mg via ORAL
  Filled 2020-05-12 (×5): qty 1

## 2020-05-12 MED ORDER — PREGABALIN 75 MG PO CAPS
150.0000 mg | ORAL_CAPSULE | Freq: Three times a day (TID) | ORAL | Status: DC
  Administered 2020-05-12 – 2020-05-16 (×14): 150 mg via ORAL
  Filled 2020-05-12 (×14): qty 2

## 2020-05-12 MED ORDER — PREGABALIN 75 MG PO CAPS: 150.0000 mg | ORAL_CAPSULE | Freq: Three times a day (TID) | ORAL | Status: DC

## 2020-05-12 MED ORDER — DIPHENHYDRAMINE HCL 50 MG/ML IJ SOLN
25.0000 mg | Freq: Four times a day (QID) | INTRAMUSCULAR | Status: DC | PRN
  Administered 2020-05-12: 25 mg via INTRAVENOUS
  Filled 2020-05-12: qty 1

## 2020-05-12 MED ORDER — FOLIC ACID 1 MG PO TABS
1.0000 mg | ORAL_TABLET | Freq: Every day | ORAL | Status: DC
  Administered 2020-05-12 – 2020-05-16 (×5): 1 mg via ORAL
  Filled 2020-05-12 (×5): qty 1

## 2020-05-12 MED ORDER — DEXTROSE-NACL 5-0.2 % IV SOLN: INTRAVENOUS | Status: AC

## 2020-05-12 MED ORDER — ORAL CARE MOUTH RINSE
15.0000 mL | Freq: Two times a day (BID) | OROMUCOSAL | Status: DC
  Administered 2020-05-12 – 2020-05-16 (×9): 15 mL via OROMUCOSAL

## 2020-05-12 MED ORDER — TRAZODONE HCL 50 MG PO TABS: 50.0000 mg | ORAL_TABLET | ORAL | Status: DC | PRN

## 2020-05-12 MED ORDER — ACETAMINOPHEN 650 MG RE SUPP: 650.0000 mg | Freq: Four times a day (QID) | RECTAL | Status: DC | PRN

## 2020-05-12 MED ORDER — PROMETHAZINE HCL 25 MG/ML IJ SOLN
12.5000 mg | Freq: Four times a day (QID) | INTRAMUSCULAR | Status: DC | PRN
  Administered 2020-05-12 – 2020-05-15 (×2): 12.5 mg via INTRAVENOUS
  Filled 2020-05-12 (×2): qty 1

## 2020-05-12 MED ORDER — ADULT MULTIVITAMIN W/MINERALS CH
1.0000 | ORAL_TABLET | Freq: Every day | ORAL | Status: DC
  Administered 2020-05-12 – 2020-05-16 (×5): 1 via ORAL
  Filled 2020-05-12 (×5): qty 1

## 2020-05-12 MED ORDER — OXYCODONE HCL 5 MG PO TABS
15.0000 mg | ORAL_TABLET | ORAL | Status: DC | PRN
  Administered 2020-05-13 – 2020-05-16 (×5): 15 mg via ORAL
  Filled 2020-05-12 (×5): qty 3

## 2020-05-12 MED ORDER — LINACLOTIDE 72 MCG PO CAPS
72.0000 ug | ORAL_CAPSULE | Freq: Every morning | ORAL | Status: DC
  Administered 2020-05-13 – 2020-05-16 (×3): 72 ug via ORAL
  Filled 2020-05-12 (×5): qty 1

## 2020-05-12 MED ORDER — ALBUTEROL SULFATE HFA 108 (90 BASE) MCG/ACT IN AERS
2.0000 | INHALATION_SPRAY | RESPIRATORY_TRACT | Status: DC | PRN
  Filled 2020-05-12: qty 6.7

## 2020-05-12 MED ORDER — HYDROMORPHONE HCL 1 MG/ML IJ SOLN
1.0000 mg | INTRAMUSCULAR | Status: DC | PRN
  Administered 2020-05-12 – 2020-05-16 (×19): 1 mg via INTRAVENOUS
  Filled 2020-05-12 (×20): qty 1

## 2020-05-12 MED ORDER — PANTOPRAZOLE SODIUM 40 MG PO TBEC
40.0000 mg | DELAYED_RELEASE_TABLET | Freq: Every day | ORAL | Status: DC
  Administered 2020-05-12 – 2020-05-16 (×5): 40 mg via ORAL
  Filled 2020-05-12 (×5): qty 1

## 2020-05-12 MED ORDER — METHADONE HCL 5 MG PO TABS
10.0000 mg | ORAL_TABLET | Freq: Two times a day (BID) | ORAL | Status: DC
  Administered 2020-05-12 – 2020-05-16 (×10): 10 mg via ORAL
  Filled 2020-05-12: qty 2
  Filled 2020-05-12: qty 1
  Filled 2020-05-12 (×5): qty 2
  Filled 2020-05-12: qty 1
  Filled 2020-05-12 (×2): qty 2

## 2020-05-12 MED ORDER — ACETAMINOPHEN 325 MG PO TABS: 650.0000 mg | ORAL_TABLET | Freq: Four times a day (QID) | ORAL | Status: DC | PRN

## 2020-05-12 NOTE — Progress Notes (Signed)
  Echocardiogram 2D Echocardiogram has been performed.  Pieter Partridge 05/12/2020, 3:06 PM

## 2020-05-12 NOTE — Progress Notes (Signed)
ANTICOAGULATION CONSULT NOTE - Follow Up Consult  Pharmacy Consult for heparin Indication: acute pulmonary embolus  Allergies  Allergen Reactions  . Amoxicillin Swelling  . Hydroxyurea Hives  . Tape Itching    Adhesive Tape  . Latex Rash  . Morphine And Related Nausea And Vomiting and Rash    Patient Measurements: Height: 6' (182.9 cm) Weight: 111.8 kg (246 lb 7.6 oz) IBW/kg (Calculated) : 77.6 Heparin Dosing Weight: 101 kg  Vital Signs: Temp: 96.9 F (36.1 C) (01/05 0800) Temp Source: Axillary (01/05 0800) BP: 115/77 (01/05 0900) Pulse Rate: 97 (01/05 0900)  Labs: Recent Labs    05/11/20 2032 05/11/20 2218 05/12/20 0401 05/12/20 0513  HGB 11.6*  --  10.2*  --   HCT 32.2*  --  28.6*  --   PLT 289  --  261  --   HEPARINUNFRC  --   --   --  0.57  CREATININE 1.19  --  1.16  --   TROPONINIHS 88* 91* 88* 90*    Estimated Creatinine Clearance: 109.3 mL/min (by C-G formula based on SCr of 1.16 mg/dL).   Assessment: Patient is a 40 y.o M with sickle cell anemia presented to the ED on 05/11/20 with c/o SOB and syncope.  Chest CTA came back positive for bilateral PE with evidence of right heart strain. LE doppler neg for DVT.  He's currently on heparin drip for acute VTE.  Today, 05/12/2020: - confirmatory heparin level came back therapeutic at 0.68 (upper end of range) with heparin drip running at 1800 units/hr - hgb 10.2, plts 261K - no bleeding documented  Goal of Therapy:  Heparin level 0.3-0.7 units/ml Monitor platelets by anticoagulation protocol: Yes   Plan:  - continue heparin drip at 1800 units/hr - since level is at upper end of goal range, will recheck another level at 6p to ensure level is still within goal range before changing to daily monitoring - monitor for s/sx bleeding  Annisa Mazzarella P 05/12/2020,10:32 AM

## 2020-05-12 NOTE — Progress Notes (Signed)
Bilateral lower extremity venous duplex has been completed. Preliminary results can be found in CV Proc through chart review.   05/12/20 9:48 AM Olen Cordial RVT

## 2020-05-12 NOTE — Progress Notes (Signed)
ANTICOAGULATION CONSULT NOTE - Follow Up Consult  Pharmacy Consult for heparin Indication: acute pulmonary embolus  Allergies  Allergen Reactions  . Amoxicillin Swelling    Patient had a possible allergic reaction to amoxicillin in March of 2021. He had some tongue swelling and difficulty swallowing after being started on amoxicillin post-tonsillectomy. Noted that he had previously tolerated amoxicillin in 2020. No anaphylaxis noted in evaluation in ED.  Marland Kitchen Hydroxyurea Hives  . Tape Itching    Adhesive Tape  . Latex Rash  . Morphine And Related Nausea And Vomiting and Rash    Patient Measurements: Height: 6' (182.9 cm) Weight: 111.8 kg (246 lb 7.6 oz) IBW/kg (Calculated) : 77.6 Heparin Dosing Weight: 101 kg  Vital Signs: Temp: 97.7 F (36.5 C) (01/05 1557) Temp Source: Oral (01/05 1557) BP: 124/81 (01/05 1557) Pulse Rate: 104 (01/05 1557)  Labs: Recent Labs    05/11/20 2032 05/11/20 2218 05/12/20 0401 05/12/20 0513 05/12/20 1100 05/12/20 1758  HGB 11.6*  --  10.2*  --   --   --   HCT 32.2*  --  28.6*  --   --   --   PLT 289  --  261  --   --   --   HEPARINUNFRC  --   --   --  0.57 0.68 0.56  CREATININE 1.19  --  1.16  --   --   --   TROPONINIHS 88* 91* 88* 90*  --   --     Estimated Creatinine Clearance: 109.3 mL/min (by C-G formula based on SCr of 1.16 mg/dL).   Assessment: Patient is a 40 y.o M with sickle cell anemia presented to the ED on 05/11/20 with c/o SOB and syncope.  Chest CTA came back positive for bilateral PE with evidence of right heart strain. LE doppler neg for DVT.  He's currently on heparin drip for acute VTE.  Today, 05/12/2020: - confirmatory heparin level @ 1758 therapeutic at 0.56 with heparin drip running at 1800 units/hr - hgb 10.2, plts 261K - no bleeding documented  Goal of Therapy:  Heparin level 0.3-0.7 units/ml Monitor platelets by anticoagulation protocol: Yes   Plan:  - continue heparin drip at 1800 units/hr - daily CBC &  heparin level - monitor for s/sx bleeding  Herby Abraham, Pharm.D 05/12/2020 7:05 PM

## 2020-05-12 NOTE — ED Notes (Signed)
Attempted to call report; nurse is unable to take report at this time. Will call again soon.

## 2020-05-12 NOTE — Progress Notes (Signed)
[facility] Pharmacy Penicillin Allergy Clarification   History of B-Lactam Allergy: Joel Leblanc was given amoxicillin post-tonsillectomy in March of 2021. After he began to take the amoxicillin, he noticed some tongue swelling and difficulty swallowing. He was evaluated in the ED on 07/31/19 and it was noted that he had no clear swelling on exam and was not anaphylactic. Diagnosis was possible allergic reaction versus a reaction to the procedure. Joel Leblanc had previously tolerated amoxicillin in 2020. Question true allergic reaction versus reaction to tonsillectomy.    [name], PharmD, BCPS, BCIDP Infectious Diseases Clinical Pharmacist Phone: [phone] 05/12/2020 10:58 AM
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
## 2020-05-12 NOTE — TOC Initial Note (Signed)
Transition of Care [facility]) - Initial/Assessment Note    Patient Details  Name: Joel Leblanc MRN: [id number] Date of Birth: 12/20/1980  [facility]) CM/SW Contact:    Lanier Clam, RN Phone Number: 05/12/2020, 3:02 PM  Clinical Narrative: Monitor for d/c needs.                  Expected Discharge Plan: Home/Self Care Barriers to Discharge: Continued Medical Work up   Patient Goals and CMS Choice Patient states their goals for this hospitalization and ongoing recovery are:: return home CMS Medicare.gov Compare Post Acute Care list provided to:: Patient    Expected Discharge Plan and Services Expected Discharge Plan: Home/Self Care   Discharge Planning Services: CM Consult   Living arrangements for the past 2 months: Single Family Home                                      Prior Living Arrangements/Services Living arrangements for the past 2 months: Single Family Home Lives with:: Self Patient language and need for interpreter reviewed:: Yes Do you feel safe going back to the place where you live?: Yes      Need for Family Participation in Patient Care: No (Comment) Care giver support system in place?: Yes (comment)   Criminal Activity/Legal Involvement Pertinent to Current Situation/Hospitalization: No - Comment as needed  Activities of Daily Living Home Assistive Devices/Equipment: Brace (specify type) ADL Screening (condition at time of admission) Patient's cognitive ability adequate to safely complete daily activities?: Yes Is the patient deaf or have difficulty hearing?: No Does the patient have difficulty seeing, even when wearing glasses/contacts?: No Does the patient have difficulty concentrating, remembering, or making decisions?: No Patient able to express need for assistance with ADLs?: No Does the patient have difficulty dressing or bathing?: No Independently performs ADLs?: Yes (appropriate for developmental age) Does the  patient have difficulty walking or climbing stairs?: Yes Weakness of Legs: Right Weakness of Arms/Hands: None  Permission Sought/Granted Permission sought to share information with : Case Manager Permission granted to share information with : Yes, Verbal Permission Granted  Share Information with NAME: Case Manager           Emotional Assessment Appearance:: Appears stated age Attitude/Demeanor/Rapport: Gracious Affect (typically observed): Accepting Orientation: : Oriented to Self,Oriented to Place,Oriented to  Time,Oriented to Situation Alcohol / Substance Use: Not Applicable Psych Involvement: No (comment)  Admission diagnosis:  Acute pulmonary embolism (HCC) [I26.99] Multiple subsegmental pulmonary emboli without acute cor pulmonale (HCC) [I26.94] Patient Active Problem List   Diagnosis Date Noted  . Acute pulmonary embolism (HCC) 05/12/2020  . Acute pulmonary embolism with acute cor pulmonale (HCC) 05/11/2020  . Sickle cell pain crisis (HCC) 05/14/2019  . Chronic pain syndrome 11/19/2018  . Moderate episode of recurrent major depressive disorder (HCC) 11/19/2018  . Weight gain 11/19/2018  . Hypertrophy of tonsils 11/19/2018  . Vitamin D deficiency 11/19/2018  . 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 Sickle cell crisis (HCC) 11/01/2016  . 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 Asthma 10/31/2016   PCP:  Massie Maroon, FNP Pharmacy:   [facility] [phone] - [place], [place] - [address] [place] [address] Phone: [phone] Fax: [phone]     Social Determinants of Health (SDOH) Interventions    Readmission Risk Interventions No flowsheet data found.

## 2020-05-12 NOTE — Progress Notes (Signed)
Subjective: Patient admitted this morning, see detailed H&P by Dr Toniann Fail 40 year old male with history of sickle cell anemia, asthma, migraine, sleep apnea, history of right foot drop has been having worsening shortness before was 1 week.  In the ED he was found to be tachycardic, CTA of the chest showed bilateral pulmonary embolism with right heart strain.  Troponin was elevated at 88.  Vitals:   05/12/20 1100 05/12/20 1154  BP: 116/76 119/86  Pulse: (!) 102 96  Resp: 18 19  Temp: 97.7 F (36.5 C) 97.6 F (36.4 C)  SpO2: 94% 100%      A/P Acute PE with right heart strain-seen on CT chest, patient hemodynamically stable, admitted to stepdown.  He was started on heparin per pharmacy.  Follow 2D echo, venous duplex Lower extremity was negative for DVT. Bilateral knee pain-from sickle cell pain crisis, continue methadone, as needed oxycodone. Asthma-stable History of migraine-stable    Meredeth Ide [facility] [phone]

## 2020-05-13 DIAGNOSIS — I2694 Multiple subsegmental pulmonary emboli without acute cor pulmonale: Secondary | ICD-10-CM | POA: Diagnosis not present

## 2020-05-13 DIAGNOSIS — D5700 Hb-SS disease with crisis, unspecified: Secondary | ICD-10-CM | POA: Diagnosis not present

## 2020-05-13 DIAGNOSIS — I2699 Other pulmonary embolism without acute cor pulmonale: Secondary | ICD-10-CM | POA: Diagnosis not present

## 2020-05-13 LAB — CBC
HCT: 28.7 % — ABNORMAL LOW (ref 39.0–52.0)
Hemoglobin: 10.2 g/dL — ABNORMAL LOW (ref 13.0–17.0)
MCH: 27.3 pg (ref 26.0–34.0)
MCHC: 35.5 g/dL (ref 30.0–36.0)
MCV: 76.9 fL — ABNORMAL LOW (ref 80.0–100.0)
Platelets: 255 10*3/uL (ref 150–400)
RBC: 3.73 MIL/uL — ABNORMAL LOW (ref 4.22–5.81)
RDW: 20.3 % — ABNORMAL HIGH (ref 11.5–15.5)
WBC: 18.2 10*3/uL — ABNORMAL HIGH (ref 4.0–10.5)
nRBC: 12.1 % — ABNORMAL HIGH (ref 0.0–0.2)

## 2020-05-13 LAB — HEPARIN LEVEL (UNFRACTIONATED): Heparin Unfractionated: 0.58 IU/mL (ref 0.30–0.70)

## 2020-05-13 MED ORDER — DIPHENHYDRAMINE HCL 25 MG PO CAPS: 25.0000 mg | ORAL_CAPSULE | Freq: Four times a day (QID) | ORAL | Status: DC | PRN

## 2020-05-13 MED ORDER — SALINE SPRAY 0.65 % NA SOLN
1.0000 | NASAL | Status: DC | PRN
  Administered 2020-05-13: 1 via NASAL
  Filled 2020-05-13: qty 44

## 2020-05-13 NOTE — Plan of Care (Signed)
  Problem: Activity: Goal: Risk for activity intolerance will decrease Outcome: Progressing   Problem: Nutrition: Goal: Adequate nutrition will be maintained Outcome: Progressing   Problem: Pain Managment: Goal: General experience of comfort will improve Outcome: Progressing   Problem: Safety: Goal: Ability to remain free from injury will improve Outcome: Progressing   Problem: Consults Goal: Pharmacy Consult for anticoagulation Outcome: Progressing

## 2020-05-13 NOTE — Progress Notes (Signed)
PHARMACIST - PHYSICIAN COMMUNICATION  Key Points: Use following P&T approved IV to PO diphenhydramine (Benadryl) policy.  DR:   Sharl Ma CONCERNING: IV to Oral Route Change Policy  RECOMMENDATION: This patient is receiving diphenhydramine by the intravenous route.  Based on criteria approved by the Pharmacy and Therapeutics Committee, intravenous diphenhydramine is being converted to the equivalent oral dose form(s).   DESCRIPTION: These criteria include:  Diphenhydramine is not prescribed to treat or prevent a severe allergic reaction  Diphenhydramine is not prescribed as premedication prior to receiving blood product, biologic medication, antimicrobial, or chemotherapy agent  The patient has tolerated at least one dose of an oral or enteral medication  The patient has no evidence of active gastrointestinal bleeding or impaired GI absorption (gastrectomy, short bowel, patient on TNA or NPO).  The patient is not undergoing procedural sedation   If you have questions about this conversion, please contact the Pharmacy Department  [x]   [phone])  [facility]   [place] PharmD, BCPS Clinical Pharmacist [facility] main pharmacy [phone] 05/13/2020 8:50 AM

## 2020-05-13 NOTE — Progress Notes (Signed)
[facility]  PROGRESS NOTE  [name] ZOX:[id number] DOB: 01-31-1981 DOA: 05/11/2020 PCP: Massie Maroon, FNP   Brief HPI:    40 year old male with history of sickle cell anemia, asthma, migraine, sleep apnea, history of right foot drop has been having worsening shortness before was 1 week.  In the ED he was found to be tachycardic, CTA of the chest showed bilateral pulmonary embolism with right heart strain.  Troponin was elevated at 88   Subjective   Patient seen and examined, denies shortness of breath.  Appreciate pulmonology consultation.  No management recommended for right heart strain at this time.   Assessment/Plan:     1. Acute PE with right heart strain-seen on CTA chest, patient is currently hemodynamically stable.  He was started on heparin per pharmacy.  Pulmonology was consulted for right heart strain.  Right ventricular function is moderately reduced.  There is severely elevated pulmonary artery systolic pressure.  As per pulmonology, there is high risk of decompensation however there are no therapies to mitigate the risk.  We will continue observation for next 24 hours if stable will discharge home on Eliquis.  Pulmonology will follow-up as outpatient and repeat TTE in 3 months to evaluate improved right heart function and resolution of PE.  We will check home oxygen needs before discharge. 2. Bilateral knee pain/sickle cell crisis-continue methadone, as needed Dilaudid     COVID-19 Labs  No results for input(s): DDIMER, FERRITIN, LDH, CRP in the last 72 hours.  Lab Results  Component Value Date   SARSCOV2NAA NEGATIVE 05/11/2020   SARSCOV2NAA NEGATIVE 07/21/2019     Scheduled medications:   . Chlorhexidine Gluconate Cloth  6 each Topical Daily  . cyclobenzaprine  10 mg Oral TID  . folic acid  1 mg Oral Daily  . linaclotide  72 mcg Oral q morning - 10a  . loratadine  10 mg Oral Daily  . mouth rinse  15 mL Mouth Rinse BID  . methadone  10  mg Oral BID  . mometasone-formoterol  2 puff Inhalation BID  . multivitamin with minerals  1 tablet Oral Daily  . pantoprazole  40 mg Oral Daily  . pregabalin  150 mg Oral TID         CBG: No results for input(s): GLUCAP in the last 168 hours.  SpO2: 92 % O2 Flow Rate (L/min): 5 L/min    CBC: Recent Labs  Lab 05/11/20 2032 05/12/20 0401 05/13/20 0725  WBC 19.3* 19.3* 18.2*  NEUTROABS 13.1* 10.2*  --   HGB 11.6* 10.2* 10.2*  HCT 32.2* 28.6* 28.7*  MCV 75.2* 75.7* 76.9*  PLT 289 261 255    Basic Metabolic Panel: Recent Labs  Lab 05/11/20 2032 05/12/20 0401  NA 138 139  K 3.8 3.7  CL 106 109  CO2 19* 19*  GLUCOSE 143* 135*  BUN 12 13  CREATININE 1.19 1.16  CALCIUM 9.2 8.8*     Liver Function Tests: Recent Labs  Lab 05/11/20 2032 05/12/20 0401  AST 50* 53*  ALT 53* 55*  ALKPHOS 68 58  BILITOT 3.8* 3.6*  PROT 7.8 6.8  ALBUMIN 4.3 3.7     Antibiotics: Anti-infectives (From admission, onward)   None       DVT prophylaxis: Heparin  Code Status: Full code  Family Communication: No family at bedside   Consultants:  Pulmonology  Procedures:      Objective   Vitals:   05/12/20 2025 05/13/20 8119 05/13/20 1478 05/13/20 1207  BP: 119/73 119/73  112/71  Pulse: 99 97  100  Resp: 19 18  18   Temp: 97.6 F (36.4 C) 98.3 F (36.8 C)  98.5 F (36.9 C)  TempSrc:  Oral  Oral  SpO2: 98% 95% 94% 92%  Weight:      Height:        Intake/Output Summary (Last 24 hours) at 05/13/2020 1438 Last data filed at 05/13/2020 0912 Gross per 24 hour  Intake 2809.61 ml  Output 1425 ml  Net 1384.61 ml    01/04 1901 - 01/06 0700 In: 3376.6 [P.O.:840; I.V.:2536.6] Out: 2075 [Urine:2075]  Filed Weights   05/11/20 2253 05/12/20 0200  Weight: 111.1 kg 111.8 kg    Physical Examination:    General-appears in no acute distress  Heart-S1-S2, regular, no murmur auscultated  Lungs-clear to auscultation bilaterally, no wheezing or crackles  auscultated  Abdomen-soft, nontender, no organomegaly  Extremities-no edema in the lower extremities  Neuro-alert, oriented x3, no focal deficit noted   Status is: Inpatient  Dispo: The patient is from: Home              Anticipated d/c is to: Home              Anticipated d/c date is: 05/14/2020              Patient currently not stable for discharge  Barrier to discharge-ongoing observation for hemodynamic compromise for submassive PE            Data Reviewed:   Recent Results (from the past 240 hour(s))  Resp Panel by RT-PCR (Flu A&B, Covid) Nasopharyngeal Swab     Status: None   Collection Time: 05/11/20  9:18 PM   Specimen: Nasopharyngeal Swab; Nasopharyngeal(NP) swabs in vial transport medium  Result Value Ref Range Status   SARS Coronavirus 2 by RT PCR NEGATIVE NEGATIVE Final    Comment: (NOTE) SARS-CoV-2 target nucleic acids are NOT DETECTED.  The SARS-CoV-2 RNA is generally detectable in upper respiratory specimens during the acute phase of infection. The lowest concentration of SARS-CoV-2 viral copies this assay can detect is 138 copies/mL. A negative result does not preclude SARS-Cov-2 infection and should not be used as the sole basis for treatment or other patient management decisions. A negative result may occur with  improper specimen collection/handling, submission of specimen other than nasopharyngeal swab, presence of viral mutation(s) within the areas targeted by this assay, and inadequate number of viral copies(<138 copies/mL). A negative result must be combined with clinical observations, patient history, and epidemiological information. The expected result is Negative.  Fact Sheet for Patients:  07/09/20  Fact Sheet for Healthcare Providers:  BloggerCourse.com  This test is no t yet approved or cleared by the [place] FDA and  has been authorized for detection and/or diagnosis  of SARS-CoV-2 by FDA under an Emergency Use Authorization (EUA). This EUA will remain  in effect (meaning this test can be used) for the duration of the COVID-19 declaration under Section 564(b)(1) of the Act, 21 U.S.C.section 360bbb-3(b)(1), unless the authorization is terminated  or revoked sooner.       Influenza A by PCR NEGATIVE NEGATIVE Final   Influenza B by PCR NEGATIVE NEGATIVE Final    Comment: (NOTE) The Xpert Xpress SARS-CoV-2/FLU/RSV plus assay is intended as an aid in the diagnosis of influenza from Nasopharyngeal swab specimens and should not be used as a sole basis for treatment. Nasal washings and aspirates are unacceptable for Xpert Xpress SARS-CoV-2/FLU/RSV testing.  Fact Sheet for Patients: BloggerCourse.comhttps://www.fda.gov/media/152166/download  Fact Sheet for Healthcare Providers: SeriousBroker.ithttps://www.fda.gov/media/152162/download  This test is not yet approved or cleared by the [place] FDA and has been authorized for detection and/or diagnosis of SARS-CoV-2 by FDA under an Emergency Use Authorization (EUA). This EUA will remain in effect (meaning this test can be used) for the duration of the COVID-19 declaration under Section 564(b)(1) of the Act, 21 U.S.C. section 360bbb-3(b)(1), unless the authorization is terminated or revoked.  Performed at [facility], [address] [place], [place] [address]   MRSA PCR Screening     Status: None   Collection Time: 05/12/20  2:10 AM   Specimen: Nasal Mucosa; Nasopharyngeal  Result Value Ref Range Status   MRSA by PCR NEGATIVE NEGATIVE Final    Comment:        The GeneXpert MRSA Assay (FDA approved for NASAL specimens only), is one component of a comprehensive MRSA colonization surveillance program. It is not intended to diagnose MRSA infection nor to guide or monitor treatment for MRSA infections. Performed at [facility], [address] [place], [place] [address]     No  results for input(s): LIPASE, AMYLASE in the last 168 hours. No results for input(s): AMMONIA in the last 168 hours.  Cardiac Enzymes: No results for input(s): CKTOTAL, CKMB, CKMBINDEX, TROPONINI in the last 168 hours. BNP (last 3 results) No results for input(s): BNP in the last 8760 hours.  ProBNP (last 3 results) No results for input(s): PROBNP in the last 8760 hours.  Studies:  DG Chest 2 View  Result Date: 05/11/2020 CLINICAL DATA:  40 year old male with sickle cell and shortness of breath. EXAM: CHEST - 2 VIEW COMPARISON:  Chest radiograph dated 07/14/2013. FINDINGS: Faint bilateral upper lobe linear densities, likely vascular prominence. No focal consolidation, pleural effusion, pneumothorax. The cardiac silhouette is within limits. No acute osseous pathology. IMPRESSION: No acute cardiopulmonary process. Electronically Signed   By: Elgie CollardArash  Radparvar M.D.   On: 05/11/2020 20:34   CT Angio Chest PE W/Cm &/Or Wo Cm  Result Date: 05/11/2020 CLINICAL DATA:  Syncope. EXAM: CT ANGIOGRAPHY CHEST WITH CONTRAST TECHNIQUE: Multidetector CT imaging of the chest was performed using the standard protocol during bolus administration of intravenous contrast. Multiplanar CT image reconstructions and MIPs were obtained to evaluate the vascular anatomy. CONTRAST:  100mL OMNIPAQUE IOHEXOL 350 MG/ML SOLN COMPARISON:  None. FINDINGS: Cardiovascular: Moderate severity areas of intraluminal low attenuation are seen within bilateral upper lobe, bilateral lower lobe and right middle lobe branches of the pulmonary arteries, right greater than left. Associated right heart strain is noted. The cardiac size is otherwise normal. A trace amount of pericardial fluid is noted. Mediastinum/Nodes: No enlarged mediastinal, hilar, or axillary lymph nodes. Thyroid gland, trachea, and esophagus demonstrate no significant findings. Lungs/Pleura: Mild areas of atelectasis and/or early infiltrate are seen within the bilateral lung  bases and along the anteromedial aspects of the bilateral upper lobes. There is no evidence of a pleural effusion or pneumothorax. Upper Abdomen: A very small spleen is seen. Musculoskeletal: No chest wall abnormality. No acute or significant osseous findings. Review of the MIP images confirms the above findings. IMPRESSION: 1. Moderate severity bilateral pulmonary embolism, right greater than left, with associated right heart strain. 2. Mild bilateral atelectasis and/or early infiltrate. 3. Very small spleen. Electronically Signed   By: Aram Candelahaddeus  Houston M.D.   On: 05/11/2020 22:49   ECHOCARDIOGRAM COMPLETE  Result Date: 05/12/2020    ECHOCARDIOGRAM REPORT  Patient Name:   Joel Leblanc Date of Exam: 05/12/2020 Medical Rec #:  [id number]             Height:       72.0 in Accession #:    [id number]            Weight:       246.5 lb Date of Birth:  06/17/1980             BSA:          2.328 m Patient Age:    39 years              BP:           119/86 mmHg Patient Gender: M                     HR:           96 bpm. Exam Location:  Inpatient Procedure: 2D Echo, Cardiac Doppler and Color Doppler Indications:    Pulmonary embolus  History:        Patient has no prior history of Echocardiogram examinations.                 Arrythmias:tachycardia, Signs/Symptoms:Shortness of Breath; Risk                 Factors:Obesity. Sickle cell anemia.  Sonographer:    Lavenia AtlasBrooke Strickland Referring Phys: [phone] ARSHAD N KAKRAKANDY IMPRESSIONS  1. Left ventricular ejection fraction, by estimation, is 60 to 65%. The left ventricle has normal function. The left ventricle has no regional wall motion abnormalities. Left ventricular diastolic parameters were normal.  2. Right ventricular systolic function is moderately reduced. The right ventricular size is mildly enlarged. There is severely elevated pulmonary artery systolic pressure. The estimated right ventricular systolic pressure is 77.9 mmHg.  3. Right atrial size was severely  dilated.  4. The pericardial effusion is surrounding the apex.  5. The mitral valve is normal in structure. Trivial mitral valve regurgitation. No evidence of mitral stenosis.  6. Tricuspid valve regurgitation is moderate.  7. The aortic valve is normal in structure. Aortic valve regurgitation is not visualized. No aortic stenosis is present.  8. The inferior vena cava is dilated in size with >50% respiratory variability, suggesting right atrial pressure of 8 mmHg. FINDINGS  Left Ventricle: Left ventricular ejection fraction, by estimation, is 60 to 65%. The left ventricle has normal function. The left ventricle has no regional wall motion abnormalities. The left ventricular internal cavity size was normal in size. There is  no left ventricular hypertrophy. Left ventricular diastolic parameters were normal. Normal left ventricular filling pressure. Right Ventricle: The right ventricular size is mildly enlarged. No increase in right ventricular wall thickness. Right ventricular systolic function is moderately reduced. There is severely elevated pulmonary artery systolic pressure. The tricuspid regurgitant velocity is 4.18 m/s, and with an assumed right atrial pressure of 8 mmHg, the estimated right ventricular systolic pressure is 77.9 mmHg. Left Atrium: Left atrial size was normal in size. Right Atrium: Right atrial size was severely dilated. Pericardium: Trivial pericardial effusion is present. The pericardial effusion is surrounding the apex. Mitral Valve: The mitral valve is normal in structure. Trivial mitral valve regurgitation. No evidence of mitral valve stenosis. Tricuspid Valve: The tricuspid valve is normal in structure. Tricuspid valve regurgitation is moderate . No evidence of tricuspid stenosis. Aortic Valve: The aortic valve is normal in structure. Aortic valve regurgitation is not visualized. No  aortic stenosis is present. Pulmonic Valve: The pulmonic valve was normal in structure. Pulmonic valve  regurgitation is not visualized. No evidence of pulmonic stenosis. Aorta: The aortic root is normal in size and structure. Venous: The inferior vena cava is dilated in size with greater than 50% respiratory variability, suggesting right atrial pressure of 8 mmHg. IAS/Shunts: No atrial level shunt detected by color flow Doppler.  LEFT VENTRICLE PLAX 2D LVIDd:         4.40 cm  Diastology LVIDs:         2.80 cm  LV e' medial:    6.96 cm/s LV PW:         1.10 cm  LV E/e' medial:  8.1 LV IVS:        1.10 cm  LV e' lateral:   11.60 cm/s LVOT diam:     2.00 cm  LV E/e' lateral: 4.8 LV SV:         38 LV SV Index:   16 LVOT Area:     3.14 cm  RIGHT VENTRICLE RV Basal diam:  3.70 cm RV S prime:     9.14 cm/s TAPSE (M-mode): 2.1 cm LEFT ATRIUM             Index       RIGHT ATRIUM           Index LA diam:        3.60 cm 1.55 cm/m  RA Area:     28.20 cm LA Vol (A2C):   43.6 ml 18.73 ml/m RA Volume:   109.00 ml 46.81 ml/m LA Vol (A4C):   56.6 ml 24.31 ml/m LA Biplane Vol: 54.8 ml 23.54 ml/m  AORTIC VALVE LVOT Vmax:   92.70 cm/s LVOT Vmean:  51.100 cm/s LVOT VTI:    0.122 m  AORTA Ao Root diam: 2.90 cm MITRAL VALVE               TRICUSPID VALVE MV Area (PHT): 9.85 cm    TR Peak grad:   69.9 mmHg MV Decel Time: 77 msec     TR Vmax:        418.00 cm/s MV E velocity: 56.10 cm/s MV A velocity: 55.70 cm/s  SHUNTS MV E/A ratio:  1.01        Systemic VTI:  0.12 m                            Systemic Diam: 2.00 cm Armanda Magic MD Electronically signed by Armanda Magic MD Signature Date/Time: 05/12/2020/4:01:21 PM    Final    VAS [place] LOWER EXTREMITY VENOUS (DVT)  Result Date: 05/12/2020  Lower Venous DVT Study Indications: Pulmonary embolism.  Risk Factors: None identified. Limitations: Poor ultrasound/tissue interface. Comparison Study: No prior studies. Performing Technologist: Chanda Busing RVT  Examination Guidelines: A complete evaluation includes B-mode imaging, spectral Doppler, color Doppler, and power Doppler as needed of  all accessible portions of each vessel. Bilateral testing is considered an integral part of a complete examination. Limited examinations for reoccurring indications may be performed as noted. The reflux portion of the exam is performed with the patient in reverse Trendelenburg.  +---------+---------------+---------+-----------+----------+-------------------+ RIGHT    CompressibilityPhasicitySpontaneityPropertiesThrombus Aging      +---------+---------------+---------+-----------+----------+-------------------+ CFV      Full           Yes      Yes                                      +---------+---------------+---------+-----------+----------+-------------------+  SFJ      Full                                                             +---------+---------------+---------+-----------+----------+-------------------+ FV Prox  Full                                                             +---------+---------------+---------+-----------+----------+-------------------+ FV Mid   Full                                                             +---------+---------------+---------+-----------+----------+-------------------+ FV DistalFull                                                             +---------+---------------+---------+-----------+----------+-------------------+ PFV      Full                                                             +---------+---------------+---------+-----------+----------+-------------------+ POP      Full           Yes      Yes                                      +---------+---------------+---------+-----------+----------+-------------------+ PTV      Full                                                             +---------+---------------+---------+-----------+----------+-------------------+ PERO                                                  Patency shown with                                                         color doppler.      +---------+---------------+---------+-----------+----------+-------------------+   +---------+---------------+---------+-----------+----------+--------------+ LEFT     CompressibilityPhasicitySpontaneityPropertiesThrombus Aging +---------+---------------+---------+-----------+----------+--------------+ CFV      Full  Yes      Yes                                 +---------+---------------+---------+-----------+----------+--------------+ SFJ      Full                                                        +---------+---------------+---------+-----------+----------+--------------+ FV Prox  Full                                                        +---------+---------------+---------+-----------+----------+--------------+ FV Mid   Full                                                        +---------+---------------+---------+-----------+----------+--------------+ FV DistalFull                                                        +---------+---------------+---------+-----------+----------+--------------+ PFV      Full                                                        +---------+---------------+---------+-----------+----------+--------------+ POP      Full           Yes      Yes                                 +---------+---------------+---------+-----------+----------+--------------+ PTV      Full                                                        +---------+---------------+---------+-----------+----------+--------------+ PERO     Full                                                        +---------+---------------+---------+-----------+----------+--------------+     Summary: RIGHT: - There is no evidence of deep vein thrombosis in the lower extremity.  - No cystic structure found in the popliteal fossa.  LEFT: - There is no evidence of deep vein thrombosis in the lower extremity.  - No cystic  structure found in the popliteal fossa.  *See table(s) above for measurements and observations. Electronically signed by Heath Lark on 05/12/2020 at 12:21:51 PM.  Final        Meredeth Ide   [facility] If 7PM-7AM, please contact night-coverage at [email or web address], Office  [phone]   05/13/2020, 2:38 PM  LOS: 2 days

## 2020-05-13 NOTE — Progress Notes (Signed)
ANTICOAGULATION CONSULT NOTE - Follow Up Consult  Pharmacy Consult for heparin Indication: acute pulmonary embolus  Allergies  Allergen Reactions  . Amoxicillin Swelling    Patient had a possible allergic reaction to amoxicillin in March of 2021. He had some tongue swelling and difficulty swallowing after being started on amoxicillin post-tonsillectomy. Noted that he had previously tolerated amoxicillin in 2020. No anaphylaxis noted in evaluation in ED.  Marland Kitchen Hydroxyurea Hives  . Tape Itching    Adhesive Tape  . Latex Rash  . Morphine And Related Nausea And Vomiting and Rash    Patient Measurements: Height: 6' (182.9 cm) Weight: 111.8 kg (246 lb 7.6 oz) IBW/kg (Calculated) : 77.6 Heparin Dosing Weight: 101 kg  Vital Signs: Temp: 98.3 F (36.8 C) (01/06 0517) Temp Source: Oral (01/06 0517) BP: 119/73 (01/06 0517) Pulse Rate: 97 (01/06 0517)  Labs: Recent Labs    05/11/20 2032 05/11/20 2218 05/12/20 0401 05/12/20 0513 05/12/20 1100 05/12/20 1758  HGB 11.6*  --  10.2*  --   --   --   HCT 32.2*  --  28.6*  --   --   --   PLT 289  --  261  --   --   --   HEPARINUNFRC  --   --   --  0.57 0.68 0.56  CREATININE 1.19  --  1.16  --   --   --   TROPONINIHS 88* 91* 88* 90*  --   --     Estimated Creatinine Clearance: 109.3 mL/min (by C-G formula based on SCr of 1.16 mg/dL).   Assessment: Patient is a 40 y.o M with sickle cell anemia presented to the ED on 05/11/20 with c/o SOB and syncope.  Chest CTA came back positive for bilateral PE with evidence of right heart strain. LE doppler neg for DVT.  Today, 05/13/2020: - heparin level 0.58, therapeutic on heparin drip 1800 units/hr - CBC: remains low/stable at 10.2, Plt decreased to WNL at 255. - No bleeding documented  Goal of Therapy:  Heparin level 0.3-0.7 units/ml Monitor platelets by anticoagulation protocol: Yes   Plan:  Continue heparin IV infusion at 1800 units/hr Daily heparin level and CBC   Lynann Beaver  PharmD, BCPS Clinical Pharmacist [facility] main pharmacy [phone] 05/13/2020 7:21 AM

## 2020-05-14 ENCOUNTER — Other Ambulatory Visit: Payer: Self-pay | Admitting: Pulmonary Disease

## 2020-05-14 DIAGNOSIS — I2694 Multiple subsegmental pulmonary emboli without acute cor pulmonale: Secondary | ICD-10-CM | POA: Diagnosis not present

## 2020-05-14 DIAGNOSIS — I2601 Septic pulmonary embolism with acute cor pulmonale: Secondary | ICD-10-CM

## 2020-05-14 DIAGNOSIS — D5700 Hb-SS disease with crisis, unspecified: Secondary | ICD-10-CM

## 2020-05-14 DIAGNOSIS — F331 Major depressive disorder, recurrent, moderate: Secondary | ICD-10-CM

## 2020-05-14 DIAGNOSIS — D72829 Elevated white blood cell count, unspecified: Secondary | ICD-10-CM

## 2020-05-14 DIAGNOSIS — I2609 Other pulmonary embolism with acute cor pulmonale: Secondary | ICD-10-CM | POA: Diagnosis not present

## 2020-05-14 DIAGNOSIS — G894 Chronic pain syndrome: Secondary | ICD-10-CM

## 2020-05-14 LAB — CBC
HCT: 30 % — ABNORMAL LOW (ref 39.0–52.0)
Hemoglobin: 10.3 g/dL — ABNORMAL LOW (ref 13.0–17.0)
MCH: 28 pg (ref 26.0–34.0)
MCHC: 34.3 g/dL (ref 30.0–36.0)
MCV: 81.5 fL (ref 80.0–100.0)
Platelets: 232 10*3/uL (ref 150–400)
RBC: 3.68 MIL/uL — ABNORMAL LOW (ref 4.22–5.81)
RDW: 21.4 % — ABNORMAL HIGH (ref 11.5–15.5)
WBC: 15 10*3/uL — ABNORMAL HIGH (ref 4.0–10.5)
nRBC: 11.9 % — ABNORMAL HIGH (ref 0.0–0.2)

## 2020-05-14 LAB — HEPARIN LEVEL (UNFRACTIONATED): Heparin Unfractionated: 0.54 IU/mL (ref 0.30–0.70)

## 2020-05-14 MED ORDER — APIXABAN 5 MG PO TABS: 5.0000 mg | ORAL_TABLET | Freq: Two times a day (BID) | ORAL | Status: DC

## 2020-05-14 MED ORDER — APIXABAN 5 MG PO TABS
10.0000 mg | ORAL_TABLET | Freq: Two times a day (BID) | ORAL | Status: DC
  Administered 2020-05-14 – 2020-05-16 (×5): 10 mg via ORAL
  Filled 2020-05-14 (×5): qty 2

## 2020-05-14 NOTE — Plan of Care (Signed)
  Problem: Education: Goal: Knowledge of General Education information will improve Description: Including pain rating scale, medication(s)/side effects and non-pharmacologic comfort measures Outcome: Progressing   Problem: Clinical Measurements: Goal: Will remain free from infection Outcome: Progressing Goal: Diagnostic test results will improve Outcome: Progressing   Problem: Activity: Goal: Risk for activity intolerance will decrease Outcome: Progressing   Problem: Education: Goal: Knowledge of vaso-occlusive preventative measures will improve Outcome: Progressing Goal: Awareness of infection prevention will improve Outcome: Progressing Goal: Awareness of signs and symptoms of anemia will improve Outcome: Progressing Goal: Long-term complications will improve Outcome: Progressing   Problem: Tissue Perfusion: Goal: Complications related to inadequate tissue perfusion will be avoided or minimized Outcome: Progressing   Problem: Fluid Volume: Goal: Ability to maintain a balanced intake and output will improve Outcome: Progressing   Problem: Sensory: Goal: Pain level will decrease with appropriate interventions Outcome: Progressing

## 2020-05-14 NOTE — Progress Notes (Signed)
SATURATION QUALIFICATIONS: (This note is used to comply with regulatory documentation for home oxygen)  Patient Saturations on Room Air at Rest = 95%  Patient Saturations on Room Air while Ambulating = 88-92% sustaining 92%  Patient Saturations on 5 Liters of oxygen while Ambulating =97-99 %  Please briefly explain why patient needs home oxygen:

## 2020-05-14 NOTE — Care Management Important Message (Signed)
Important Message  Patient Details IM Letter given to the Patient. Name: Spurgeon Gancarz Sealey MRN: [id number] Date of Birth: 1980/12/10   Medicare Important Message Given:  Yes     Caren Macadam 05/14/2020, 12:46 PM

## 2020-05-14 NOTE — Progress Notes (Signed)
3 month TTE and f/u appt with me requested for RV dysfunction in setting of submassive PE.

## 2020-05-14 NOTE — Progress Notes (Signed)
Subjective: Joel Leblanc is a 40 year old male with a medical history significant for sickle cell disease type [place], chronic pain syndrome, history of migraine headaches, recurrent major depressive disorder, attention deficit disorder, and history of mild intermittent asthma was admitted for pulmonary embolism in the setting of sickle cell pain crisis. Patient has continued on heparin drip for pulmonary embolism.  He says that bilateral knee pain is improved.  He rates pain as 3/10.  Patient currently has an oxygen requirement of 2 L.  His oxygen saturation is 96% at this time. He denies any headache, chest pain, dizziness, urinary symptoms, nausea, vomiting, or diarrhea.  Objective:  Vital signs in last 24 hours:  Vitals:   05/13/20 1207 05/13/20 1807 05/13/20 2045 05/14/20 0458  BP: 112/71  115/74 95/66  Pulse: 100  (!) 105 100  Resp: 18   20  Temp: 98.5 F (36.9 C)   98.2 F (36.8 C)  TempSrc: Oral   Oral  SpO2: 92% 95% 96% 93%  Weight:      Height:        Intake/Output from previous day:   Intake/Output Summary (Last 24 hours) at 05/14/2020 1109 Last data filed at 05/14/2020 0319 Gross per 24 hour  Intake 1516.62 ml  Output 850 ml  Net 666.62 ml    Physical Exam: General: Alert, awake, oriented x3, in no acute distress.  HEENT: [place]/AT PEERL, EOMI Neck: Trachea midline,  no masses, no thyromegal,y no JVD, no carotid bruit OROPHARYNX:  Moist, No exudate/ erythema/lesions.  Heart: Regular rate and rhythm, without murmurs, rubs, gallops, PMI non-displaced, no heaves or thrills on palpation.  Lungs: Clear to auscultation, no wheezing or rhonchi noted. No increased vocal fremitus resonant to percussion  Abdomen: Soft, nontender, nondistended, positive bowel sounds, no masses no hepatosplenomegaly noted..  Neuro: No focal neurological deficits noted cranial nerves II through XII grossly intact. DTRs 2+ bilaterally upper and lower extremities. Strength 5 out of 5 in bilateral upper  and lower extremities. Musculoskeletal: No warm swelling or erythema around joints, no spinal tenderness noted. Psychiatric: Patient alert and oriented x3, good insight and cognition, good recent to remote recall. Lymph node survey: No cervical axillary or inguinal lymphadenopathy noted.  Lab Results:  Basic Metabolic Panel:    Component Value Date/Time   NA 139 05/12/2020 0401   NA 141 10/07/2019 1148   NA 139 07/18/2013 0507   K 3.7 05/12/2020 0401   K 3.5 07/18/2013 0507   CL 109 05/12/2020 0401   CL 110 (H) 07/18/2013 0507   CO2 19 (L) 05/12/2020 0401   CO2 28 07/18/2013 0507   BUN 13 05/12/2020 0401   BUN 11 10/07/2019 1148   BUN 4 (L) 07/18/2013 0507   CREATININE 1.16 05/12/2020 0401   CREATININE 0.91 07/18/2013 0507   GLUCOSE 135 (H) 05/12/2020 0401   GLUCOSE 86 07/18/2013 0507   CALCIUM 8.8 (L) 05/12/2020 0401   CALCIUM 8.7 07/18/2013 0507   CBC:    Component Value Date/Time   WBC 15.0 (H) 05/14/2020 0615   HGB 10.3 (L) 05/14/2020 0615   HGB 11.8 (L) 10/07/2019 1148   HCT 30.0 (L) 05/14/2020 0615   HCT 36.6 (L) 10/07/2019 1148   PLT 232 05/14/2020 0615   PLT 459 (H) 10/07/2019 1148   MCV 81.5 05/14/2020 0615   MCV 79 10/07/2019 1148   MCV 76 (L) 07/16/2013 0504   NEUTROABS 10.2 (H) 05/12/2020 0401   NEUTROABS 7.8 (H) 10/07/2019 1148   NEUTROABS 6.1  07/16/2013 0504   LYMPHSABS 6.7 (H) 05/12/2020 0401   LYMPHSABS 2.9 10/07/2019 1148   LYMPHSABS 1.3 07/16/2013 0504   MONOABS 1.6 (H) 05/12/2020 0401   MONOABS 1.2 (H) 07/16/2013 0504   EOSABS 0.5 05/12/2020 0401   EOSABS 0.2 10/07/2019 1148   EOSABS 0.1 07/16/2013 0504   BASOSABS 0.1 05/12/2020 0401   BASOSABS 0.1 10/07/2019 1148   BASOSABS 0.0 07/16/2013 0504    Recent Results (from the past 240 hour(s))  Resp Panel by RT-PCR (Flu A&B, Covid) Nasopharyngeal Swab     Status: None   Collection Time: 05/11/20  9:18 PM   Specimen: Nasopharyngeal Swab; Nasopharyngeal(NP) swabs in vial transport medium   Result Value Ref Range Status   SARS Coronavirus 2 by RT PCR NEGATIVE NEGATIVE Final    Comment: (NOTE) SARS-CoV-2 target nucleic acids are NOT DETECTED.  The SARS-CoV-2 RNA is generally detectable in upper respiratory specimens during the acute phase of infection. The lowest concentration of SARS-CoV-2 viral copies this assay can detect is 138 copies/mL. A negative result does not preclude SARS-Cov-2 infection and should not be used as the sole basis for treatment or other patient management decisions. A negative result may occur with  improper specimen collection/handling, submission of specimen other than nasopharyngeal swab, presence of viral mutation(s) within the areas targeted by this assay, and inadequate number of viral copies(<138 copies/mL). A negative result must be combined with clinical observations, patient history, and epidemiological information. The expected result is Negative.  Fact Sheet for Patients:  BloggerCourse.com  Fact Sheet for Healthcare Providers:  SeriousBroker.it  This test is no t yet approved or cleared by the [place] FDA and  has been authorized for detection and/or diagnosis of SARS-CoV-2 by FDA under an Emergency Use Authorization (EUA). This EUA will remain  in effect (meaning this test can be used) for the duration of the COVID-19 declaration under Section 564(b)(1) of the Act, 21 U.S.C.section 360bbb-3(b)(1), unless the authorization is terminated  or revoked sooner.       Influenza A by PCR NEGATIVE NEGATIVE Final   Influenza B by PCR NEGATIVE NEGATIVE Final    Comment: (NOTE) The Xpert Xpress SARS-CoV-2/FLU/RSV plus assay is intended as an aid in the diagnosis of influenza from Nasopharyngeal swab specimens and should not be used as a sole basis for treatment. Nasal washings and aspirates are unacceptable for Xpert Xpress SARS-CoV-2/FLU/RSV testing.  Fact Sheet for  Patients: BloggerCourse.com  Fact Sheet for Healthcare Providers: SeriousBroker.it  This test is not yet approved or cleared by the [place] FDA and has been authorized for detection and/or diagnosis of SARS-CoV-2 by FDA under an Emergency Use Authorization (EUA). This EUA will remain in effect (meaning this test can be used) for the duration of the COVID-19 declaration under Section 564(b)(1) of the Act, 21 U.S.C. section 360bbb-3(b)(1), unless the authorization is terminated or revoked.  Performed at [facility], [address], [place], [place] [address]   MRSA PCR Screening     Status: None   Collection Time: 05/12/20  2:10 AM   Specimen: Nasal Mucosa; Nasopharyngeal  Result Value Ref Range Status   MRSA by PCR NEGATIVE NEGATIVE Final    Comment:        The GeneXpert MRSA Assay (FDA approved for NASAL specimens only), is one component of a comprehensive MRSA colonization surveillance program. It is not intended to diagnose MRSA infection nor to guide or monitor treatment for MRSA infections. Performed at [facility],  [address], [place], [place] [address]     Studies/Results: ECHOCARDIOGRAM COMPLETE  Result Date: 05/12/2020    ECHOCARDIOGRAM REPORT   Patient Name:   TASEAN MANCHA Kuhner Date of Exam: 05/12/2020 Medical Rec #:  [id number]             Height:       72.0 in Accession #:    [id number]            Weight:       246.5 lb Date of Birth:  18-Oct-1980             BSA:          2.328 m Patient Age:    39 years              BP:           119/86 mmHg Patient Gender: M                     HR:           96 bpm. Exam Location:  Inpatient Procedure: 2D Echo, Cardiac Doppler and Color Doppler Indications:    Pulmonary embolus  History:        Patient has no prior history of Echocardiogram examinations.                 Arrythmias:tachycardia, Signs/Symptoms:Shortness of Breath; Risk                  Factors:Obesity. Sickle cell anemia.  Sonographer:    Lavenia Atlas Referring Phys: [phone] ARSHAD N KAKRAKANDY IMPRESSIONS  1. Left ventricular ejection fraction, by estimation, is 60 to 65%. The left ventricle has normal function. The left ventricle has no regional wall motion abnormalities. Left ventricular diastolic parameters were normal.  2. Right ventricular systolic function is moderately reduced. The right ventricular size is mildly enlarged. There is severely elevated pulmonary artery systolic pressure. The estimated right ventricular systolic pressure is 77.9 mmHg.  3. Right atrial size was severely dilated.  4. The pericardial effusion is surrounding the apex.  5. The mitral valve is normal in structure. Trivial mitral valve regurgitation. No evidence of mitral stenosis.  6. Tricuspid valve regurgitation is moderate.  7. The aortic valve is normal in structure. Aortic valve regurgitation is not visualized. No aortic stenosis is present.  8. The inferior vena cava is dilated in size with >50% respiratory variability, suggesting right atrial pressure of 8 mmHg. FINDINGS  Left Ventricle: Left ventricular ejection fraction, by estimation, is 60 to 65%. The left ventricle has normal function. The left ventricle has no regional wall motion abnormalities. The left ventricular internal cavity size was normal in size. There is  no left ventricular hypertrophy. Left ventricular diastolic parameters were normal. Normal left ventricular filling pressure. Right Ventricle: The right ventricular size is mildly enlarged. No increase in right ventricular wall thickness. Right ventricular systolic function is moderately reduced. There is severely elevated pulmonary artery systolic pressure. The tricuspid regurgitant velocity is 4.18 m/s, and with an assumed right atrial pressure of 8 mmHg, the estimated right ventricular systolic pressure is 77.9 mmHg. Left Atrium: Left atrial size was normal in size.  Right Atrium: Right atrial size was severely dilated. Pericardium: Trivial pericardial effusion is present. The pericardial effusion is surrounding the apex. Mitral Valve: The mitral valve is normal in structure. Trivial mitral valve regurgitation. No evidence of mitral valve stenosis. Tricuspid Valve: The tricuspid valve is normal in structure. Tricuspid  valve regurgitation is moderate . No evidence of tricuspid stenosis. Aortic Valve: The aortic valve is normal in structure. Aortic valve regurgitation is not visualized. No aortic stenosis is present. Pulmonic Valve: The pulmonic valve was normal in structure. Pulmonic valve regurgitation is not visualized. No evidence of pulmonic stenosis. Aorta: The aortic root is normal in size and structure. Venous: The inferior vena cava is dilated in size with greater than 50% respiratory variability, suggesting right atrial pressure of 8 mmHg. IAS/Shunts: No atrial level shunt detected by color flow Doppler.  LEFT VENTRICLE PLAX 2D LVIDd:         4.40 cm  Diastology LVIDs:         2.80 cm  LV e' medial:    6.96 cm/s LV PW:         1.10 cm  LV E/e' medial:  8.1 LV IVS:        1.10 cm  LV e' lateral:   11.60 cm/s LVOT diam:     2.00 cm  LV E/e' lateral: 4.8 LV SV:         38 LV SV Index:   16 LVOT Area:     3.14 cm  RIGHT VENTRICLE RV Basal diam:  3.70 cm RV S prime:     9.14 cm/s TAPSE (M-mode): 2.1 cm LEFT ATRIUM             Index       RIGHT ATRIUM           Index LA diam:        3.60 cm 1.55 cm/m  RA Area:     28.20 cm LA Vol (A2C):   43.6 ml 18.73 ml/m RA Volume:   109.00 ml 46.81 ml/m LA Vol (A4C):   56.6 ml 24.31 ml/m LA Biplane Vol: 54.8 ml 23.54 ml/m  AORTIC VALVE LVOT Vmax:   92.70 cm/s LVOT Vmean:  51.100 cm/s LVOT VTI:    0.122 m  AORTA Ao Root diam: 2.90 cm MITRAL VALVE               TRICUSPID VALVE MV Area (PHT): 9.85 cm    TR Peak grad:   69.9 mmHg MV Decel Time: 77 msec     TR Vmax:        418.00 cm/s MV E velocity: 56.10 cm/s MV A velocity: 55.70 cm/s   SHUNTS MV E/A ratio:  1.01        Systemic VTI:  0.12 m                            Systemic Diam: 2.00 cm Fransico Him MD Electronically signed by Fransico Him MD Signature Date/Time: 05/12/2020/4:01:21 PM    Final     Medications: Scheduled Meds: . apixaban  10 mg Oral BID   Followed by  . [START ON 05/21/2020] apixaban  5 mg Oral BID  . Chlorhexidine Gluconate Cloth  6 each Topical Daily  . cyclobenzaprine  10 mg Oral TID  . folic acid  1 mg Oral Daily  . linaclotide  72 mcg Oral q morning - 10a  . loratadine  10 mg Oral Daily  . mouth rinse  15 mL Mouth Rinse BID  . methadone  10 mg Oral BID  . mometasone-formoterol  2 puff Inhalation BID  . multivitamin with minerals  1 tablet Oral Daily  . pantoprazole  40 mg Oral Daily  . pregabalin  150 mg  Oral TID   Continuous Infusions: PRN Meds:.acetaminophen **OR** acetaminophen, albuterol, diphenhydrAMINE, HYDROmorphone (DILAUDID) injection, ondansetron (ZOFRAN) IV, oxyCODONE, promethazine, Rimegepant Sulfate, sodium chloride, traZODone  Consultants:  Pharmacy  Pulmonology  Procedures:  None  Antibiotics:  None   Assessment/Plan: Principal Problem:   Acute pulmonary embolism with acute cor pulmonale (HCC) Active Problems:   Sickle cell crisis (HCC)   Acute pulmonary embolism (HCC)  Acute pulmonary embolism with right heart strain: Acute pulmonary embolism with right heart strain was visualized on CT of chest.  Heparin drip per pharmacy consult was continued overnight.  Today, will transition to Eliquis per pharmacy. Pulmonology was consulted for right heart strain.  Patient has severely elevated pulmonary arterial systolic pressure.  As per pulmonology, there is high risk of decompensation and there are no therapies to mitigate the risk.  Recommend transitioning to Eliquis and discharged home.  Appreciate pulmonology's input.  Pulmonology will follow-up as an outpatient and repeat TTE in 3 months to evaluate improved right  heart function and resolution of PE. Patient has an oxygen requirement of 2 L.  He states that he was previously on home oxygen but has not had it for several months.  Ambulating pulse ox today to assess oxygen needs going forward.  Sickle cell disease type [place] with pain crisis: Continue oxycodone 15 mg every 4 hours as needed Dilaudid 1 mg every 2 hours as needed for severe breakthrough pain Monitor vital signs very closely, reevaluate pain scale regularly, and supplemental oxygen as needed.  Patient will be reassessed for pain in the context of function and relationship to baseline as his care progresses.  Sickle cell anemia: Patient's hemoglobin is 10.3, which is consistent with his baseline.  There is no clinical indication for blood transfusion at this time.  Continue to follow closely.  CBC in a.m.  Leukocytosis: WBCs 15.0.  More than likely secondary to sickle cell pain crisis.  Continue to follow closely without antibiotics.  Chronic pain syndrome: Continue home medications  Major depressive disorder: Continue home medications.  Patient denies any suicidal or homicidal ideations.  Continue to follow closely.  Mild intermittent asthma: Stable.  Home medications as needed.   Code Status: Full Code Family Communication: N/A Disposition Plan: Not yet ready for discharge.  Discharge plan for 05/15/2020.  Nolon Nations  APRN, MSN, FNP-C Patient Care [facility] [address] [place], [place] [address] [phone]  If 7PM-7AM, please contact night-coverage.  05/14/2020, 11:09 AM  LOS: 3 days

## 2020-05-14 NOTE — Progress Notes (Addendum)
ANTICOAGULATION CONSULT NOTE - Follow Up Consult  Pharmacy Consult for heparin Indication: acute pulmonary embolus  Allergies  Allergen Reactions  . Amoxicillin Swelling    Patient had a possible allergic reaction to amoxicillin in March of 2021. He had some tongue swelling and difficulty swallowing after being started on amoxicillin post-tonsillectomy. Noted that he had previously tolerated amoxicillin in 2020. No anaphylaxis noted in evaluation in ED.  Marland Kitchen Hydroxyurea Hives  . Tape Itching    Adhesive Tape  . Latex Rash  . Morphine And Related Nausea And Vomiting and Rash    Patient Measurements: Height: 6' (182.9 cm) Weight: 111.8 kg (246 lb 7.6 oz) IBW/kg (Calculated) : 77.6 Heparin Dosing Weight: 101 kg  Vital Signs: Temp: 98.2 F (36.8 C) (01/07 0458) Temp Source: Oral (01/07 0458) BP: 95/66 (01/07 0458) Pulse Rate: 100 (01/07 0458)  Labs: Recent Labs    05/11/20 2032 05/11/20 2218 05/12/20 0401 05/12/20 0513 05/12/20 1100 05/12/20 1758 05/13/20 0725 05/14/20 0615  HGB 11.6*  --  10.2*  --   --   --  10.2* 10.3*  HCT 32.2*  --  28.6*  --   --   --  28.7* 30.0*  PLT 289  --  261  --   --   --  255 232  HEPARINUNFRC  --   --   --  0.57   < > 0.56 0.58 0.54  CREATININE 1.19  --  1.16  --   --   --   --   --   TROPONINIHS 88* 91* 88* 90*  --   --   --   --    < > = values in this interval not displayed.    Estimated Creatinine Clearance: 109.3 mL/min (by C-G formula based on SCr of 1.16 mg/dL).   Assessment: Patient is a 40 y.o M with sickle cell anemia presented to the ED on 05/11/20 with c/o SOB and syncope.  Chest CTA came back positive for bilateral PE with evidence of right heart strain. LE doppler neg for DVT.  Today, 05/14/2020: - heparin level 0.54, therapeutic on heparin drip 1800 units/hr - CBC: remains low/stable at 10.3, Plt WNL - No bleeding documented  Goal of Therapy:  Heparin level 0.3-0.7 units/ml Monitor platelets by anticoagulation  protocol: Yes   Plan:  Continue heparin IV infusion at 1800 units/hr Daily heparin level and CBC Follow up plans for transition to oral [facility]  Loralee Pacas, PharmD, BCPS [facility] main pharmacy [phone] 05/14/2020 7:21 AM   Addendum: transition heparin drip to eliquis  Stop heparin drip, then immediately begin eliquis 10mg  PO bid x 7days, then 5mg  BID thereafter  Will provide 30 day free coupon voucher and education prior to discharge  , PharmD, BCPS 05/14/2020 10:13 AM

## 2020-05-15 LAB — CBC
HCT: 29 % — ABNORMAL LOW (ref 39.0–52.0)
Hemoglobin: 10.2 g/dL — ABNORMAL LOW (ref 13.0–17.0)
MCH: 27.1 pg (ref 26.0–34.0)
MCHC: 35.2 g/dL (ref 30.0–36.0)
MCV: 77.1 fL — ABNORMAL LOW (ref 80.0–100.0)
Platelets: 282 10*3/uL (ref 150–400)
RBC: 3.76 MIL/uL — ABNORMAL LOW (ref 4.22–5.81)
RDW: 19.6 % — ABNORMAL HIGH (ref 11.5–15.5)
WBC: 13.5 10*3/uL — ABNORMAL HIGH (ref 4.0–10.5)
nRBC: 7 % — ABNORMAL HIGH (ref 0.0–0.2)

## 2020-05-15 NOTE — Progress Notes (Signed)
[name], male   DOB: July 05, 1980, 40 y.o.   MRN: [id number] Subjective: Joel Leblanc is a 40 year old male with a medical history significant for sickle cell disease type [place], chronic pain syndrome, history of migraine headaches, recurrent major depressive disorder, attention deficit disorder, and history of mild intermittent asthma was admitted for pulmonary embolism in the setting of sickle cell pain crisis.  Today, patient feels much better but still desaturating on ambulation.  He is saturating between 88 to 92%, but quickly improved to 97% on oxygen.  His pain is down to 4/10 and is manageable at home but will need home oxygen.  Patient has been transitioned from heparin drip to Eliquis, tolerating it well.  He denies any headache, chest pain, dizziness, urinary symptoms, nausea, vomiting or diarrhea.  Patient has no cough, no chest pain.  Objective:  Vital signs in last 24 hours:  Vitals:   05/14/20 2154 05/15/20 0558 05/15/20 0830 05/15/20 1327  BP: 115/78 110/65  118/75  Pulse: 96 90  95  Resp: 18 18  15   Temp:  (!) 97.3 F (36.3 C)    TempSrc:  Oral    SpO2: 94% 96% 93% 99%  Weight:      Height:        Intake/Output from previous day:   Intake/Output Summary (Last 24 hours) at 05/15/2020 1437 Last data filed at 05/15/2020 1005 Gross per 24 hour  Intake 720 ml  Output 2350 ml  Net -1630 ml    Physical Exam: General: Alert, awake, oriented x3, in no acute distress.  HEENT: [place]/AT PEERL, EOMI Neck: Trachea midline,  no masses, no thyromegal,y no JVD, no carotid bruit OROPHARYNX:  Moist, No exudate/ erythema/lesions.  Heart: Regular rate and rhythm, without murmurs, rubs, gallops, PMI non-displaced, no heaves or thrills on palpation.  Lungs: Clear to auscultation, no wheezing or rhonchi noted. No increased vocal fremitus resonant to percussion  Abdomen: Soft, nontender, nondistended, positive bowel sounds, no masses no hepatosplenomegaly noted..   Neuro: No focal neurological deficits noted cranial nerves II through XII grossly intact. DTRs 2+ bilaterally upper and lower extremities. Strength 5 out of 5 in bilateral upper and lower extremities. Musculoskeletal: No warm swelling or erythema around joints, no spinal tenderness noted. Psychiatric: Patient alert and oriented x3, good insight and cognition, good recent to remote recall. Lymph node survey: No cervical axillary or inguinal lymphadenopathy noted.  Lab Results:  Basic Metabolic Panel:    Component Value Date/Time   NA 139 05/12/2020 0401   NA 141 10/07/2019 1148   NA 139 07/18/2013 0507   K 3.7 05/12/2020 0401   K 3.5 07/18/2013 0507   CL 109 05/12/2020 0401   CL 110 (H) 07/18/2013 0507   CO2 19 (L) 05/12/2020 0401   CO2 28 07/18/2013 0507   BUN 13 05/12/2020 0401   BUN 11 10/07/2019 1148   BUN 4 (L) 07/18/2013 0507   CREATININE 1.16 05/12/2020 0401   CREATININE 0.91 07/18/2013 0507   GLUCOSE 135 (H) 05/12/2020 0401   GLUCOSE 86 07/18/2013 0507   CALCIUM 8.8 (L) 05/12/2020 0401   CALCIUM 8.7 07/18/2013 0507   CBC:    Component Value Date/Time   WBC 13.5 (H) 05/15/2020 1107   HGB 10.2 (L) 05/15/2020 1107   HGB 11.8 (L) 10/07/2019 1148   HCT 29.0 (L) 05/15/2020 1107   HCT 36.6 (L) 10/07/2019 1148   PLT 282 05/15/2020 1107   PLT 459 (H) 10/07/2019 1148  MCV 77.1 (L) 05/15/2020 1107   MCV 79 10/07/2019 1148   MCV 76 (L) 07/16/2013 0504   NEUTROABS 10.2 (H) 05/12/2020 0401   NEUTROABS 7.8 (H) 10/07/2019 1148   NEUTROABS 6.1 07/16/2013 0504   LYMPHSABS 6.7 (H) 05/12/2020 0401   LYMPHSABS 2.9 10/07/2019 1148   LYMPHSABS 1.3 07/16/2013 0504   MONOABS 1.6 (H) 05/12/2020 0401   MONOABS 1.2 (H) 07/16/2013 0504   EOSABS 0.5 05/12/2020 0401   EOSABS 0.2 10/07/2019 1148   EOSABS 0.1 07/16/2013 0504   BASOSABS 0.1 05/12/2020 0401   BASOSABS 0.1 10/07/2019 1148   BASOSABS 0.0 07/16/2013 0504    Recent Results (from the past 240 hour(s))  Resp Panel by  RT-PCR (Flu A&B, Covid) Nasopharyngeal Swab     Status: None   Collection Time: 05/11/20  9:18 PM   Specimen: Nasopharyngeal Swab; Nasopharyngeal(NP) swabs in vial transport medium  Result Value Ref Range Status   SARS Coronavirus 2 by RT PCR NEGATIVE NEGATIVE Final    Comment: (NOTE) SARS-CoV-2 target nucleic acids are NOT DETECTED.  The SARS-CoV-2 RNA is generally detectable in upper respiratory specimens during the acute phase of infection. The lowest concentration of SARS-CoV-2 viral copies this assay can detect is 138 copies/mL. A negative result does not preclude SARS-Cov-2 infection and should not be used as the sole basis for treatment or other patient management decisions. A negative result may occur with  improper specimen collection/handling, submission of specimen other than nasopharyngeal swab, presence of viral mutation(s) within the areas targeted by this assay, and inadequate number of viral copies(<138 copies/mL). A negative result must be combined with clinical observations, patient history, and epidemiological information. The expected result is Negative.  Fact Sheet for Patients:  BloggerCourse.com  Fact Sheet for Healthcare Providers:  SeriousBroker.it  This test is no t yet approved or cleared by the [place] FDA and  has been authorized for detection and/or diagnosis of SARS-CoV-2 by FDA under an Emergency Use Authorization (EUA). This EUA will remain  in effect (meaning this test can be used) for the duration of the COVID-19 declaration under Section 564(b)(1) of the Act, 21 U.S.C.section 360bbb-3(b)(1), unless the authorization is terminated  or revoked sooner.       Influenza A by PCR NEGATIVE NEGATIVE Final   Influenza B by PCR NEGATIVE NEGATIVE Final    Comment: (NOTE) The Xpert Xpress SARS-CoV-2/FLU/RSV plus assay is intended as an aid in the diagnosis of influenza from Nasopharyngeal swab  specimens and should not be used as a sole basis for treatment. Nasal washings and aspirates are unacceptable for Xpert Xpress SARS-CoV-2/FLU/RSV testing.  Fact Sheet for Patients: BloggerCourse.com  Fact Sheet for Healthcare Providers: SeriousBroker.it  This test is not yet approved or cleared by the [place] FDA and has been authorized for detection and/or diagnosis of SARS-CoV-2 by FDA under an Emergency Use Authorization (EUA). This EUA will remain in effect (meaning this test can be used) for the duration of the COVID-19 declaration under Section 564(b)(1) of the Act, 21 U.S.C. section 360bbb-3(b)(1), unless the authorization is terminated or revoked.  Performed at [facility], [address] [place], [place] [address]   MRSA PCR Screening     Status: None   Collection Time: 05/12/20  2:10 AM   Specimen: Nasal Mucosa; Nasopharyngeal  Result Value Ref Range Status   MRSA by PCR NEGATIVE NEGATIVE Final    Comment:        The GeneXpert MRSA Assay (FDA approved for  NASAL specimens only), is one component of a comprehensive MRSA colonization surveillance program. It is not intended to diagnose MRSA infection nor to guide or monitor treatment for MRSA infections. Performed at [facility], [address], [place], [place] [address]     Studies/Results: No results found.  Medications: Scheduled Meds: . apixaban  10 mg Oral BID   Followed by  . [START ON 05/21/2020] apixaban  5 mg Oral BID  . Chlorhexidine Gluconate Cloth  6 each Topical Daily  . cyclobenzaprine  10 mg Oral TID  . folic acid  1 mg Oral Daily  . linaclotide  72 mcg Oral q morning - 10a  . loratadine  10 mg Oral Daily  . mouth rinse  15 mL Mouth Rinse BID  . methadone  10 mg Oral BID  . mometasone-formoterol  2 puff Inhalation BID  . multivitamin with minerals  1 tablet Oral Daily  . pantoprazole  40 mg  Oral Daily  . pregabalin  150 mg Oral TID   Continuous Infusions: PRN Meds:.acetaminophen **OR** acetaminophen, albuterol, diphenhydrAMINE, HYDROmorphone (DILAUDID) injection, ondansetron (ZOFRAN) IV, oxyCODONE, promethazine, Rimegepant Sulfate, sodium chloride, traZODone  Consultants:  Pharmacy  Pulmonology  Procedures:  None  Antibiotics:  None  Assessment/Plan: Principal Problem:   Acute pulmonary embolism with acute cor pulmonale (HCC) Active Problems:   Sickle cell crisis (HCC)   Chronic pain syndrome   Moderate episode of recurrent major depressive disorder (HCC)   Multiple subsegmental pulmonary emboli without acute cor pulmonale (HCC)   Leukocytosis  1. Acute pulmonary embolism with right heart strain: Patient transitioned from heparin drip to Eliquis, patient has appointment with pulmonologist as outpatient, and scheduled to repeat TTE in 3 months to evaluate improved right heart function and possible resolution of pulmonary embolism.  Patient continues to desaturate on ambulation, sometimes down to below 88%.  Patient will need home oxygen.  This is ordered and we will consider discharging patient home tomorrow. 2. Hb Sickle Cell Disease with crisis: [facility] at [facility], continue Dilaudid IV 1 mg every 2 hours as needed for breakthrough pain, continue oxycodone 15 mg every 4 hours. Monitor vitals very closely, Re-evaluate pain scale regularly, 2 L of Oxygen by [place]. 3. Leukocytosis: Improving.  This is more likely from sickle cell crisis.  No antibiotic is needed. 4. 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  There is no clinical indication for blood transfusion today.  We will continue to monitor closely and transfuse as needed. 5. Chronic pain Syndrome: Continue home pain medications as ordered. 6. Major depressive disorder: Patient denies any suicidal ideations or thoughts. Continue home medications. Follow-up with psychiatrist as outpatient.  Code Status:  Full Code Family Communication: N/A Disposition Plan: Not yet ready for discharge  Joel Leblanc  If 7PM-7AM, please contact night-coverage.  05/15/2020, 2:37 PM  LOS: 4 days

## 2020-05-16 LAB — CBC
HCT: 27.9 % — ABNORMAL LOW (ref 39.0–52.0)
Hemoglobin: 9.9 g/dL — ABNORMAL LOW (ref 13.0–17.0)
MCH: 27.3 pg (ref 26.0–34.0)
MCHC: 35.5 g/dL (ref 30.0–36.0)
MCV: 76.9 fL — ABNORMAL LOW (ref 80.0–100.0)
Platelets: 275 10*3/uL (ref 150–400)
RBC: 3.63 MIL/uL — ABNORMAL LOW (ref 4.22–5.81)
RDW: 19.2 % — ABNORMAL HIGH (ref 11.5–15.5)
WBC: 11.2 10*3/uL — ABNORMAL HIGH (ref 4.0–10.5)
nRBC: 5.6 % — ABNORMAL HIGH (ref 0.0–0.2)

## 2020-05-16 MED ORDER — APIXABAN 5 MG PO TABS: 5.0000 mg | ORAL_TABLET | Freq: Two times a day (BID) | ORAL | 3 refills | Status: DC

## 2020-05-16 MED ORDER — APIXABAN 5 MG PO TABS: 10.0000 mg | ORAL_TABLET | Freq: Two times a day (BID) | ORAL | 0 refills | Status: DC

## 2020-05-16 NOTE — TOC Progression Note (Signed)
Transition of Care [facility]) - Progression Note    Patient Details  Name: Joel Leblanc MRN: [id number] Date of Birth: [birth year]  Transition of [facility]) CM/SW Contact  Armanda Heritage, RN Phone Number: 05/16/2020, 1:24 PM  Clinical Narrative:    Referral for home oxygen given to [employer] rep Jermaine.   Expected Discharge Plan: Home/Self Care Barriers to Discharge: Continued Medical Work up  Expected Discharge Plan and Services Expected Discharge Plan: Home/Self Care   Discharge Planning Services: CM Consult   Living arrangements for the past 2 months: Single Family Home Expected Discharge Date: 05/16/20               DME Arranged: Oxygen DME Agency: Other - Comment ([employer]) Date DME Agency Contacted: 05/16/20 Time DME Agency Contacted: [phone] Representative spoke with at DME Agency: Vaughan Basta             Social Determinants of Health (SDOH) Interventions    Readmission Risk Interventions No flowsheet data found.

## 2020-05-16 NOTE — Discharge Summary (Signed)
Physician Discharge Summary  Joel Leblanc:[id number] DOB: 20-May-1980 DOA: 05/11/2020  PCP: Joel Maroon, FNP  Admit date: 05/11/2020  Discharge date: 05/16/2020  Discharge Diagnoses:  Principal Problem:   Acute pulmonary embolism with acute cor pulmonale (HCC) Active Problems:   Sickle cell crisis (HCC)   Chronic pain syndrome   Moderate episode of recurrent major depressive disorder (HCC)   Multiple subsegmental pulmonary emboli without acute cor pulmonale (HCC)   Leukocytosis   Discharge Condition: Stable  Disposition:   Follow-up Information    Joel Maroon, FNP. Schedule an appointment as soon as possible for a visit in 2 day(s).   Specialty: Family Medicine Contact information: [address] [place] [address] [phone]              Pt is discharged home in good condition and is to follow up with Joel Maroon, FNP this week to have labs evaluated. Joel Leblanc is instructed to increase activity slowly and balance with rest for the next few days, and use prescribed medication to complete treatment of pain  Diet: Regular Wt Readings from Last 3 Encounters:  05/12/20 111.8 kg  10/07/19 107.3 kg  07/31/19 98.4 kg    History of present illness:  Joel Leblanc is a 40 y.o. male with history of sickle cell anemia, asthma, migraine and sleep apnea with history of right foot drop has been experiencing increasing shortness of breath over the last 1 week.  Patient states about a week ago he did not lose consciousness while shopping and had followed up with his primary care physician and was found to have an abnormal EKG and was referred to cardiology.  In the meantime patient shortness of breath got worse he not able to exert himself after a few steps and gets short of breath and chest pressure.  He decided to come to the ER.  ED Course: In the ER patient was tachycardic and CT angiogram of the chest shows bilateral  pulmonary embolism with strain pattern.  High sensitive troponin was 88 and 91 labs show also WBC count of 19 hemoglobin 7.6 complains of pain under both the right knee which patient states has been going on for last 3 days which he feels is typical of his sickle cell pain crisis.  On-call pulmonary critical care consult was requested and they have requested hospitalist admission.  Patient blood pressure is around 110 systolic at the time of my exam.  Patient is on 2 L oxygen by the time I was examining.  Patient has been started on heparin admitted for acute pulmonary embolism with heart strain.  Covid test is negative.  Hospital Course:  Patient was admitted for acute pulmonary embolism with right heart strain seen on CT angiogram of chest and sickle cell pain crisis and managed appropriately with IVF, IV heparin drip per protocol, IV Dilaudid as needed for pain and other adjunct therapies per sickle cell pain management protocols.  Venous duplex of lower extremities were negative for DVT.  Patient was continued on his oral methadone and oxycodone as needed for his bilateral knee pain and sickle cell pain crisis.  Patient was hemodynamically stable throughout this admission.  IV heparin was monitored closely and safely transitioned to oral Eliquis on day 3 of admission.  Patient tolerated oral anticoagulation with no evidence of bleeding or bruises.  His asthma was stable but his oxygen level dropped on ambulation to as low as 85% and improved to  above 92% on oxygen.  Occasionally oxygen dropped to 87% even on room air at rest, hence home oxygen was arranged for patient.  As of today, patient's pain is down to between 3 and 4/10 which is consistent with his baseline.  He is eating well with no restrictions, he is ambulating well with no significant pain except for dropped oxygen level as stated above.  Patient is hemodynamically stable enough for discharge to home on home oxygen.  Patient was therefore  discharged home today in a hemodynamically stable condition.   Joel Leblanc will follow-up with PCP within 1 week of this discharge. Joel Leblanc was counseled extensively about nonpharmacologic means of pain management, patient verbalized understanding and was appreciative of  the care received during this admission.  Patient will also follow-up with pulmonologist as arranged and he will repeat TTE in 3 months to evaluate improved right heart function and resolution of pulmonary embolism.  Further work-up will be based on the results of TTE in 3 months.  We discussed the need for good hydration, monitoring of hydration status, avoidance of heat, cold, stress, and infection triggers. We discussed the need to be adherent with taking Hydrea and other home medications. Patient was reminded of the need to seek medical attention immediately if any symptom of bleeding, anemia, or infection occurs.  Discharge Exam: Vitals:   05/16/20 0859 05/16/20 1227  BP:  122/74  Pulse:  (!) 103  Resp:  20  Temp:  97.8 F (36.6 C)  SpO2: 90% 93%   Vitals:   05/16/20 0323 05/16/20 0635 05/16/20 0859 05/16/20 1227  BP:  109/82  122/74  Pulse:  (!) 101  (!) 103  Resp:  15  20  Temp:    97.8 F (36.6 C)  TempSrc:    Axillary  SpO2: 94% 92% 90% 93%  Weight:      Height:        General appearance : Awake, alert, not in any distress. Speech Clear. Not toxic looking HEENT: Atraumatic and Normocephalic, pupils equally reactive to light and accomodation Neck: Supple, no JVD. No cervical lymphadenopathy.  Chest: Good air entry bilaterally, no added sounds  CVS: S1 S2 regular, no murmurs.  Abdomen: Bowel sounds present, Non tender and not distended with no gaurding, rigidity or rebound. Extremities: B/L Lower Ext shows no edema, both legs are warm to touch Neurology: Awake alert, and oriented X 3, CN II-XII intact, Non focal Skin: No Rash  Discharge Instructions  Discharge Instructions    Diet - low sodium heart  healthy   Complete by: As directed    Increase activity slowly   Complete by: As directed      Allergies as of 05/16/2020      Reactions   Amoxicillin Swelling   Patient had a possible allergic reaction to amoxicillin in March of 2021. He had some tongue swelling and difficulty swallowing after being started on amoxicillin post-tonsillectomy. Noted that he had previously tolerated amoxicillin in 2020. No anaphylaxis noted in evaluation in ED.   Hydroxyurea Hives   Tape Itching   Adhesive Tape   Latex Rash   Morphine And Related Nausea And Vomiting, Rash      Medication List    STOP taking these medications   meloxicam 15 MG tablet Commonly known as: MOBIC     TAKE these medications   Ajovy 225 MG/1.5ML Sosy Generic drug: Fremanezumab-vfrm Inject 225 mg into the skin every 30 (thirty) days.   albuterol 108 (90 Base) MCG/ACT inhaler  Commonly known as: VENTOLIN HFA Inhale 2 puffs into the lungs every 4 (four) hours as needed for wheezing or shortness of breath.   apixaban 5 MG Tabs tablet Commonly known as: ELIQUIS Take 2 tablets (10 mg total) by mouth 2 (two) times daily for 4 days.   apixaban 5 MG Tabs tablet Commonly known as: ELIQUIS Take 1 tablet (5 mg total) by mouth 2 (two) times daily. Start taking on: May 21, 2020   budesonide-formoterol 160-4.5 MCG/ACT inhaler Commonly known as: SYMBICORT Inhale 2 puffs into the lungs in the morning and at bedtime.   cetirizine 10 MG tablet Commonly known as: ZYRTEC Take 1 tablet (10 mg total) by mouth daily.   cyclobenzaprine 10 MG tablet Commonly known as: FLEXERIL Take 10 mg by mouth 3 (three) times daily.   Dexlansoprazole 30 MG capsule Take 30 mg by mouth daily.   diphenhydrAMINE 12.5 MG/5ML liquid Commonly known as: BENADRYL Take 20 mLs (50 mg total) by mouth every 6 (six) hours for 3 days.   folic acid 800 MCG tablet Commonly known as: FOLVITE Take 800 mcg by mouth daily.   Linzess 72 MCG  capsule Generic drug: linaclotide Take 72 mcg by mouth every morning.   methadone 10 MG tablet Commonly known as: DOLOPHINE Take 10 mg by mouth 2 (two) times daily.   Multi-Vitamin tablet Take 1 tablet by mouth daily.   Nurtec 75 MG Tbdp Generic drug: Rimegepant Sulfate Take 1 tablet by mouth as directed.   ondansetron 4 MG tablet Commonly known as: ZOFRAN Take 4 mg by mouth every 8 (eight) hours as needed for refractory nausea / vomiting.   oxyCODONE 15 MG immediate release tablet Commonly known as: ROXICODONE Take 1 tablet (15 mg total) by mouth every 4 (four) hours as needed for pain.   pregabalin 150 MG capsule Commonly known as: LYRICA Take 150 mg by mouth 3 (three) times daily.   promethazine 25 MG tablet Commonly known as: PHENERGAN Take 25 mg by mouth every 8 (eight) hours as needed for nausea or vomiting.   SUMAtriptan 100 MG tablet Commonly known as: IMITREX Take 100 mg by mouth every 2 (two) hours as needed for migraine.   traZODone 50 MG tablet Commonly known as: DESYREL Take 50 mg by mouth as needed for sleep.   Vitamin D (Ergocalciferol) 1.25 MG (50000 UNIT) Caps capsule Commonly known as: DRISDOL TAKE 1 CAPSULE BY MOUTH ONE TIME PER WEEK            Durable Medical Equipment  (From admission, onward)         Start     Ordered   05/16/20 1116  For home use only DME oxygen  Once       Question Answer Comment  Length of Need Lifetime   Frequency Continuous (stationary and portable oxygen unit needed)   Oxygen delivery system Gas      05/16/20 1116          The results of significant diagnostics from this hospitalization (including imaging, microbiology, ancillary and laboratory) are listed below for reference.    Significant Diagnostic Studies: DG Chest 2 View  Result Date: 05/11/2020 CLINICAL DATA:  40 year old male with sickle cell and shortness of breath. EXAM: CHEST - 2 VIEW COMPARISON:  Chest radiograph dated 07/14/2013. FINDINGS:  Faint bilateral upper lobe linear densities, likely vascular prominence. No focal consolidation, pleural effusion, pneumothorax. The cardiac silhouette is within limits. No acute osseous pathology. IMPRESSION: No acute cardiopulmonary process. Electronically Signed  By: Elgie Collard M.D.   On: 05/11/2020 20:34   CT Angio Chest PE W/Cm &/Or Wo Cm  Result Date: 05/11/2020 CLINICAL DATA:  Syncope. EXAM: CT ANGIOGRAPHY CHEST WITH CONTRAST TECHNIQUE: Multidetector CT imaging of the chest was performed using the standard protocol during bolus administration of intravenous contrast. Multiplanar CT image reconstructions and MIPs were obtained to evaluate the vascular anatomy. CONTRAST:  OMNIPAQUE IOHEXOL 350 MG/ML SOLN COMPARISON:  None. FINDINGS: Cardiovascular: Moderate severity areas of intraluminal low attenuation are seen within bilateral upper lobe, bilateral lower lobe and right middle lobe branches of the pulmonary arteries, right greater than left. Associated right heart strain is noted. The cardiac size is otherwise normal. A trace amount of pericardial fluid is noted. Mediastinum/Nodes: No enlarged mediastinal, hilar, or axillary lymph nodes. Thyroid gland, trachea, and esophagus demonstrate no significant findings. Lungs/Pleura: Mild areas of atelectasis and/or early infiltrate are seen within the bilateral lung bases and along the anteromedial aspects of the bilateral upper lobes. There is no evidence of a pleural effusion or pneumothorax. Upper Abdomen: A very small spleen is seen. Musculoskeletal: No chest wall abnormality. No acute or significant osseous findings. Review of the MIP images confirms the above findings. IMPRESSION: 1. Moderate severity bilateral pulmonary embolism, right greater than left, with associated right heart strain. 2. Mild bilateral atelectasis and/or early infiltrate. 3. Very small spleen. Electronically Signed   By: Aram Candela M.D.   On: 05/11/2020 22:49    ECHOCARDIOGRAM COMPLETE  Result Date: 05/12/2020    ECHOCARDIOGRAM REPORT   Patient Name:   Joel Leblanc Date of Exam: 05/12/2020 Medical Rec #:  [id number]             Height:       72.0 in Accession #:    [id number]            Weight:       246.5 lb Date of Birth:  1981-01-26             BSA:          2.328 m Patient Age:    39 years              BP:           119/86 mmHg Patient Gender: M                     HR:           96 bpm. Exam Location:  Inpatient Procedure: 2D Echo, Cardiac Doppler and Color Doppler Indications:    Pulmonary embolus  History:        Patient has no prior history of Echocardiogram examinations.                 Arrythmias:tachycardia, Signs/Symptoms:Shortness of Breath; Risk                 Factors:Obesity. Sickle cell anemia.  Sonographer:    Lavenia Atlas Referring Phys: [phone] ARSHAD N KAKRAKANDY IMPRESSIONS  1. Left ventricular ejection fraction, by estimation, is 60 to 65%. The left ventricle has normal function. The left ventricle has no regional wall motion abnormalities. Left ventricular diastolic parameters were normal.  2. Right ventricular systolic function is moderately reduced. The right ventricular size is mildly enlarged. There is severely elevated pulmonary artery systolic pressure. The estimated right ventricular systolic pressure is 77.9 mmHg.  3. Right atrial size was severely dilated.  4. The pericardial effusion is surrounding the apex.  5. The mitral valve is normal in structure. Trivial mitral valve regurgitation. No evidence of mitral stenosis.  6. Tricuspid valve regurgitation is moderate.  7. The aortic valve is normal in structure. Aortic valve regurgitation is not visualized. No aortic stenosis is present.  8. The inferior vena cava is dilated in size with >50% respiratory variability, suggesting right atrial pressure of 8 mmHg. FINDINGS  Left Ventricle: Left ventricular ejection fraction, by estimation, is 60 to 65%. The left ventricle has normal  function. The left ventricle has no regional wall motion abnormalities. The left ventricular internal cavity size was normal in size. There is  no left ventricular hypertrophy. Left ventricular diastolic parameters were normal. Normal left ventricular filling pressure. Right Ventricle: The right ventricular size is mildly enlarged. No increase in right ventricular wall thickness. Right ventricular systolic function is moderately reduced. There is severely elevated pulmonary artery systolic pressure. The tricuspid regurgitant velocity is 4.18 m/s, and with an assumed right atrial pressure of 8 mmHg, the estimated right ventricular systolic pressure is 77.9 mmHg. Left Atrium: Left atrial size was normal in size. Right Atrium: Right atrial size was severely dilated. Pericardium: Trivial pericardial effusion is present. The pericardial effusion is surrounding the apex. Mitral Valve: The mitral valve is normal in structure. Trivial mitral valve regurgitation. No evidence of mitral valve stenosis. Tricuspid Valve: The tricuspid valve is normal in structure. Tricuspid valve regurgitation is moderate . No evidence of tricuspid stenosis. Aortic Valve: The aortic valve is normal in structure. Aortic valve regurgitation is not visualized. No aortic stenosis is present. Pulmonic Valve: The pulmonic valve was normal in structure. Pulmonic valve regurgitation is not visualized. No evidence of pulmonic stenosis. Aorta: The aortic root is normal in size and structure. Venous: The inferior vena cava is dilated in size with greater than 50% respiratory variability, suggesting right atrial pressure of 8 mmHg. IAS/Shunts: No atrial level shunt detected by color flow Doppler.  LEFT VENTRICLE PLAX 2D LVIDd:         4.40 cm  Diastology LVIDs:         2.80 cm  LV e' medial:    6.96 cm/s LV PW:         1.10 cm  LV E/e' medial:  8.1 LV IVS:        1.10 cm  LV e' lateral:   11.60 cm/s LVOT diam:     2.00 cm  LV E/e' lateral: 4.8 LV SV:          38 LV SV Index:   16 LVOT Area:     3.14 cm  RIGHT VENTRICLE RV Basal diam:  3.70 cm RV S prime:     9.14 cm/s TAPSE (M-mode): 2.1 cm LEFT ATRIUM             Index       RIGHT ATRIUM           Index LA diam:        3.60 cm 1.55 cm/m  RA Area:     28.20 cm LA Vol (A2C):   43.6 ml 18.73 ml/m RA Volume:   109.00 ml 46.81 ml/m LA Vol (A4C):   56.6 ml 24.31 ml/m LA Biplane Vol: 54.8 ml 23.54 ml/m  AORTIC VALVE LVOT Vmax:   92.70 cm/s LVOT Vmean:  51.100 cm/s LVOT VTI:    0.122 m  AORTA Ao Root diam: 2.90 cm MITRAL VALVE  TRICUSPID VALVE MV Area (PHT): 9.85 cm    TR Peak grad:   69.9 mmHg MV Decel Time: 77 msec     TR Vmax:        418.00 cm/s MV E velocity: 56.10 cm/s MV A velocity: 55.70 cm/s  SHUNTS MV E/A ratio:  1.01        Systemic VTI:  0.12 m                            Systemic Diam: 2.00 cm Armanda Magic MD Electronically signed by Armanda Magic MD Signature Date/Time: 05/12/2020/4:01:21 PM    Final    VAS [place] LOWER EXTREMITY VENOUS (DVT)  Result Date: 05/12/2020  Lower Venous DVT Study Indications: Pulmonary embolism.  Risk Factors: None identified. Limitations: Poor ultrasound/tissue interface. Comparison Study: No prior studies. Performing Technologist: Chanda Busing RVT  Examination Guidelines: A complete evaluation includes B-mode imaging, spectral Doppler, color Doppler, and power Doppler as needed of all accessible portions of each vessel. Bilateral testing is considered an integral part of a complete examination. Limited examinations for reoccurring indications may be performed as noted. The reflux portion of the exam is performed with the patient in reverse Trendelenburg.  +---------+---------------+---------+-----------+----------+-------------------+ RIGHT    CompressibilityPhasicitySpontaneityPropertiesThrombus Aging      +---------+---------------+---------+-----------+----------+-------------------+ CFV      Full           Yes      Yes                                       +---------+---------------+---------+-----------+----------+-------------------+ SFJ      Full                                                             +---------+---------------+---------+-----------+----------+-------------------+ FV Prox  Full                                                             +---------+---------------+---------+-----------+----------+-------------------+ FV Mid   Full                                                             +---------+---------------+---------+-----------+----------+-------------------+ FV DistalFull                                                             +---------+---------------+---------+-----------+----------+-------------------+ PFV      Full                                                             +---------+---------------+---------+-----------+----------+-------------------+  POP      Full           Yes      Yes                                      +---------+---------------+---------+-----------+----------+-------------------+ PTV      Full                                                             +---------+---------------+---------+-----------+----------+-------------------+ PERO                                                  Patency shown with                                                        color doppler.      +---------+---------------+---------+-----------+----------+-------------------+   +---------+---------------+---------+-----------+----------+--------------+ LEFT     CompressibilityPhasicitySpontaneityPropertiesThrombus Aging +---------+---------------+---------+-----------+----------+--------------+ CFV      Full           Yes      Yes                                 +---------+---------------+---------+-----------+----------+--------------+ SFJ      Full                                                         +---------+---------------+---------+-----------+----------+--------------+ FV Prox  Full                                                        +---------+---------------+---------+-----------+----------+--------------+ FV Mid   Full                                                        +---------+---------------+---------+-----------+----------+--------------+ FV DistalFull                                                        +---------+---------------+---------+-----------+----------+--------------+ PFV      Full                                                        +---------+---------------+---------+-----------+----------+--------------+  POP      Full           Yes      Yes                                 +---------+---------------+---------+-----------+----------+--------------+ PTV      Full                                                        +---------+---------------+---------+-----------+----------+--------------+ PERO     Full                                                        +---------+---------------+---------+-----------+----------+--------------+     Summary: RIGHT: - There is no evidence of deep vein thrombosis in the lower extremity.  - No cystic structure found in the popliteal fossa.  LEFT: - There is no evidence of deep vein thrombosis in the lower extremity.  - No cystic structure found in the popliteal fossa.  *See table(s) above for measurements and observations. Electronically signed by Heath Lark on 05/12/2020 at 12:21:51 PM.    Final     Microbiology: Recent Results (from the past 240 hour(s))  Resp Panel by RT-PCR (Flu A&B, Covid) Nasopharyngeal Swab     Status: None   Collection Time: 05/11/20  9:18 PM   Specimen: Nasopharyngeal Swab; Nasopharyngeal(NP) swabs in vial transport medium  Result Value Ref Range Status   SARS Coronavirus 2 by RT PCR NEGATIVE NEGATIVE Final    Comment: (NOTE) SARS-CoV-2 target nucleic  acids are NOT DETECTED.  The SARS-CoV-2 RNA is generally detectable in upper respiratory specimens during the acute phase of infection. The lowest concentration of SARS-CoV-2 viral copies this assay can detect is 138 copies/mL. A negative result does not preclude SARS-Cov-2 infection and should not be used as the sole basis for treatment or other patient management decisions. A negative result may occur with  improper specimen collection/handling, submission of specimen other than nasopharyngeal swab, presence of viral mutation(s) within the areas targeted by this assay, and inadequate number of viral copies(<138 copies/mL). A negative result must be combined with clinical observations, patient history, and epidemiological information. The expected result is Negative.  Fact Sheet for Patients:  BloggerCourse.com  Fact Sheet for Healthcare Providers:  SeriousBroker.it  This test is no t yet approved or cleared by the [place] FDA and  has been authorized for detection and/or diagnosis of SARS-CoV-2 by FDA under an Emergency Use Authorization (EUA). This EUA will remain  in effect (meaning this test can be used) for the duration of the COVID-19 declaration under Section 564(b)(1) of the Act, 21 U.S.C.section 360bbb-3(b)(1), unless the authorization is terminated  or revoked sooner.       Influenza A by PCR NEGATIVE NEGATIVE Final   Influenza B by PCR NEGATIVE NEGATIVE Final    Comment: (NOTE) The Xpert Xpress SARS-CoV-2/FLU/RSV plus assay is intended as an aid in the diagnosis of influenza from Nasopharyngeal swab specimens and should not be used as a sole basis for treatment. Nasal washings and aspirates are unacceptable for Xpert Xpress SARS-CoV-2/FLU/RSV testing.  Fact Sheet for Patients: BloggerCourse.comhttps://www.fda.gov/media/152166/download  Fact Sheet for Healthcare Providers: SeriousBroker.ithttps://www.fda.gov/media/152162/download  This  test is not yet approved or cleared by the [place] FDA and has been authorized for detection and/or diagnosis of SARS-CoV-2 by FDA under an Emergency Use Authorization (EUA). This EUA will remain in effect (meaning this test can be used) for the duration of the COVID-19 declaration under Section 564(b)(1) of the Act, 21 U.S.C. section 360bbb-3(b)(1), unless the authorization is terminated or revoked.  Performed at [facility], [address] [place], [place] [id number]   MRSA PCR Screening     Status: None   Collection Time: 05/12/20  2:10 AM   Specimen: Nasal Mucosa; Nasopharyngeal  Result Value Ref Range Status   MRSA by PCR NEGATIVE NEGATIVE Final    Comment:        The GeneXpert MRSA Assay (FDA approved for NASAL specimens only), is one component of a comprehensive MRSA colonization surveillance program. It is not intended to diagnose MRSA infection nor to guide or monitor treatment for MRSA infections. Performed at [facility], [address] [place], [id number]      Labs: Basic Metabolic Panel: Recent Labs  Lab 05/11/20 2032 05/12/20 0401  NA 138 139  K 3.8 3.7  CL 106 109  CO2 19* 19*  GLUCOSE 143* 135*  BUN 12 13  CREATININE 1.19 1.16  CALCIUM 9.2 8.8*   Liver Function Tests: Recent Labs  Lab 05/11/20 2032 05/12/20 0401  AST 50* 53*  ALT 53* 55*  ALKPHOS 68 58  BILITOT 3.8* 3.6*  PROT 7.8 6.8  ALBUMIN 4.3 3.7   No results for input(s): LIPASE, AMYLASE in the last 168 hours. No results for input(s): AMMONIA in the last 168 hours. CBC: Recent Labs  Lab 05/11/20 2032 05/12/20 0401 05/13/20 0725 05/14/20 0615 05/15/20 1107 05/16/20 0614  WBC 19.3* 19.3* 18.2* 15.0* 13.5* 11.2*  NEUTROABS 13.1* 10.2*  --   --   --   --   HGB 11.6* 10.2* 10.2* 10.3* 10.2* 9.9*  HCT 32.2* 28.6* 28.7* 30.0* 29.0* 27.9*  MCV 75.2* 75.7* 76.9* 81.5 77.1* 76.9*  PLT 289 261 255 232 282 275   Cardiac  Enzymes: No results for input(s): CKTOTAL, CKMB, CKMBINDEX, TROPONINI in the last 168 hours. BNP: Invalid input(s): POCBNP CBG: No results for input(s): GLUCAP in the last 168 hours.  Time coordinating discharge: 50 minutes  Signed:  Anet Logsdon  [facility] 05/16/2020, 1:16 PM

## 2020-05-16 NOTE — Discharge Instructions (Signed)
Pulmonary Embolism  A pulmonary embolism (PE) is a sudden blockage or decrease of blood flow in one or both lungs. Most blockages come from a blood clot that forms in the vein of a lower leg, thigh, or arm (deep vein thrombosis, DVT) and travels to the lungs. A clot is blood that has thickened into a gel or solid. PE is a dangerous and life-threatening condition that needs to be treated right away. What are the causes? This condition is usually caused by a blood clot that forms in a vein and moves to the lungs. In rare cases, it may be caused by air, fat, part of a tumor, or other tissue that moves through the veins and into the lungs. What increases the risk? The following factors may make you more likely to develop this condition:  Experiencing a traumatic injury, such as breaking a hip or leg.  Having: ? A spinal cord injury. ? Orthopedic surgery, especially hip or knee replacement. ? Any major surgery. ? A stroke. ? DVT. ? Blood clots or blood clotting disease. ? Long-term (chronic) lung or heart disease. ? Cancer treated with chemotherapy. ? A central venous catheter.  Taking medicines that contain estrogen. These include birth control pills and hormone replacement therapy.  Being: ? Pregnant. ? In the period of time after your baby is delivered (postpartum). ? Older than age 35. ? Overweight. ? A smoker, especially if you have other risks. What are the signs or symptoms? Symptoms of this condition usually start suddenly and include:  Shortness of breath during activity or at rest.  Coughing, coughing up blood, or coughing up blood-tinged mucus.  Chest pain that is often worse with deep breaths.  Rapid or irregular heartbeat.  Feeling light-headed or dizzy.  Fainting.  Feeling anxious.  Fever.  Sweating.  Pain and swelling in a leg. This is a symptom of DVT, which can lead to PE. How is this diagnosed? This condition may be diagnosed based on:  Your  medical history.  A physical exam.  Blood tests.  CT pulmonary angiogram. This test checks blood flow in and around your lungs.  Ventilation-perfusion scan, also called a lung VQ scan. This test measures air flow and blood flow to the lungs.  An ultrasound of the legs. How is this treated? Treatment for this condition depends on many factors, such as the cause of your PE, your risk for bleeding or developing more clots, and other medical conditions you have. Treatment aims to remove, dissolve, or stop blood clots from forming or growing larger. Treatment may include:  Medicines, such as: ? Blood thinning medicines (anticoagulants) to stop clots from forming. ? Medicines that dissolve clots (thrombolytics).  Procedures, such as: ? Using a flexible tube to remove a blood clot (embolectomy) or to deliver medicine to destroy it (catheter-directed thrombolysis). ? Inserting a filter into a large vein that carries blood to the heart (inferior vena cava). This filter (vena cava filter) catches blood clots before they reach the lungs. ? Surgery to remove the clot (surgical embolectomy). This is rare. You may need a combination of immediate, long-term (up to 3 months after diagnosis), and extended (more than 3 months after diagnosis) treatments. Your treatment may continue for several months (maintenance therapy). You and your health care provider will work together to choose the treatment program that is best for you. Follow these instructions at home: Medicines  Take over-the-counter and prescription medicines only as told by your health care provider.  If  you are taking an anticoagulant medicine: ? Take the medicine every day at the same time each day. ? Understand what foods and drugs interact with your medicine. ? Understand the side effects of this medicine, including excessive bruising or bleeding. Ask your health care provider or pharmacist about other side effects. General  instructions  Wear a medical alert bracelet or carry a medical alert card that says you have had a PE and lists what medicines you take.  Ask your health care provider when you may return to your normal activities. Avoid sitting or lying for a long time without moving.  Maintain a healthy weight. Ask your health care provider what weight is healthy for you.  Do not use any products that contain nicotine or tobacco, such as cigarettes, e-cigarettes, and chewing tobacco. If you need help quitting, ask your health care provider.  Talk with your health care provider about any travel plans. It is important to make sure that you are still able to take your medicine while on trips.  Keep all follow-up visits as told by your health care provider. This is important. Contact a health care provider if:  You missed a dose of your blood thinner medicine. Get help right away if:  You have: ? New or increased pain, swelling, warmth, or redness in an arm or leg. ? Numbness or tingling in an arm or leg. ? Shortness of breath during activity or at rest. ? A fever. ? Chest pain. ? A rapid or irregular heartbeat. ? A severe headache. ? Vision changes. ? A serious fall or accident, or you hit your head. ? Stomach (abdominal) pain. ? Blood in your vomit, stool, or urine. ? A cut that will not stop bleeding.  You cough up blood.  You feel light-headed or dizzy.  You cannot move your arms or legs.  You are confused or have memory loss. These symptoms may represent a serious problem that is an emergency. Do not wait to see if the symptoms will go away. Get medical help right away. Call your local emergency services (911 in the [place] Do not drive yourself to the hospital. Summary  A pulmonary embolism (PE) is a sudden blockage or decrease of blood flow in one or both lungs. PE is a dangerous and life-threatening condition that needs to be treated right away.  Treatments for this condition usually  include medicines to thin your blood (anticoagulants) or medicines to break apart blood clots (thrombolytics).  If you are given blood thinners, it is important to take the medicine every day at the same time each day.  Understand what foods and drugs interact with any medicines that you are taking.  If you have signs of PE or DVT, call your local emergency services (911 in the [place] This information is not intended to replace advice given to you by your health care provider. Make sure you discuss any questions you have with your health care provider. Document Revised: 01/30/2018 Document Reviewed: 01/30/2018 Elsevier Patient Education  2020 Elsevier Inc.  Sickle Cell Anemia, Adult  Sickle cell anemia is a condition in which red blood cells have an abnormal "sickle" shape. Red blood cells carry oxygen through the body. Sickle-shaped red blood cells do not live as long as normal red blood cells. They also clump together and block blood from flowing through the blood vessels. This condition prevents the body from getting enough oxygen. Sickle cell anemia causes organ damage and pain. It also increases the risk of  infection. What are the causes? This condition is caused by a gene that is passed from parent to child (inherited). Receiving two copies of the gene causes the disease. Receiving one copy causes the "trait," which means that symptoms are milder or not present. What increases the risk? This condition is more likely to develop if your ancestors were from [place], the [place], [place] or [place], the [place], [place], or the [place]. What are the signs or symptoms? Symptoms of this condition include:  Episodes of pain (crises), especially in the hands and feet, joints, back, chest, or abdomen. The pain can be triggered by: ? An illness, especially if there is dehydration. ? Doing an activity with great effort (overexertion). ? Exposure to extreme temperature  changes. ? High altitude.  Fatigue.  Shortness of breath or difficulty breathing.  Dizziness.  Pale skin or yellowed skin (jaundice).  Frequent bacterial infections.  Pain and swelling in the hands and feet (hand-food syndrome).  Prolonged, painful erection of the penis (priapism).  Acute chest syndrome. Symptoms of this include: ? Chest pain. ? Fever. ? Cough. ? Fast breathing.  Stroke.  Decreased activity.  Loss of appetite.  Change in behavior.  Headaches.  Seizures.  Vision changes.  Skin ulcers.  Heart disease.  High blood pressure.  Gallstones.  Liver and kidney problems. How is this diagnosed? This condition is diagnosed with blood tests that check for the gene that causes this condition. How is this treated? There is no cure for most cases of this condition. Treatment focuses on managing your symptoms and preventing complications of the disease. Your health care provider will work with you to identify the best treatment options for you based on an assessment of your condition. Treatment may include:  Medicines, including: ? Pain medicines. ? Antibiotic medicines for infection. ? Medicines to increase the production of a protein in red blood cells that helps carry oxygen in the body (hemoglobin).  Fluids to treat pain and swelling.  Oxygen to treat acute chest syndrome.  Blood transfusions to treat symptoms such as fatigue, stroke, and acute chest syndrome.  Massage and physical therapy for pain.  Regular tests to monitor your condition, such as blood tests, X-rays, CT scans, MRI scans, ultrasounds, and lung function tests. These should be done every 3-12 months, depending on your age.  Hematopoietic stem cell transplant. This is a procedure to replace abnormal stem cells with healthy stem cells from a donor's bone marrow. Stem cells are cells that can develop into blood cells, and bone marrow is the spongy tissue inside the bones. Follow  these instructions at home: Medicines  Take over-the-counter and prescription medicines only as told by your health care provider.  If you were prescribed an antibiotic medicine, take it as told by your health care provider. Do not stop taking the antibiotic even if you start to feel better.  If you develop a fever, do not take medicines to reduce the fever right away. This could cover up another problem. Notify your health care provider. Managing pain, stiffness, and swelling  Try these methods to help ease your pain: ? Using a heating pad. ? Taking a warm bath. ? Distracting yourself, such as by watching TV. Eating and drinking  Drink enough fluid to keep your urine clear or pale yellow. Drink more in hot weather and during exercise.  Limit or avoid drinking alcohol.  Eat a balanced and nutritious diet. Eat plenty of fruits, vegetables, whole grains, and lean protein.  Take vitamins and supplements as directed by your health care provider. Traveling  When traveling, keep these with you: ? Your medical information. ? The names of your health care providers. ? Your medicines.  If you have to travel by air, ask about precautions you should take. Activity  Get plenty of rest.  Avoid activities that will lower your oxygen levels, such as exercising vigorously. General instructions  Do not use any products that contain nicotine or tobacco, such as cigarettes and e-cigarettes. They lower blood oxygen levels. If you need help quitting, ask your health care provider.  Consider wearing a medical alert bracelet.  Avoid high altitudes.  Avoid extreme temperatures and extreme temperature changes.  Keep all follow-up visits as told by your health care provider. This is important. Contact a health care provider if:  You develop joint pain.  Your feet or hands swell or have pain.  You have fatigue. Get help right away if:  You have symptoms of infection. These  include: ? Fever. ? Chills. ? Extreme tiredness. ? Irritability. ? Poor eating. ? Vomiting.  You feel dizzy or faint.  You have new abdominal pain, especially on the left side near the stomach area.  You develop priapism.  You have numbness in your arms or legs or have trouble moving them.  You have trouble talking.  You develop pain that cannot be controlled with medicine.  You become short of breath.  You have rapid breathing.  You have a persistent cough.  You have pain in your chest.  You develop a severe headache or stiff neck.  You feel bloated without eating or after eating a small amount of food.  Your skin is pale.  You suddenly lose vision. Summary  Sickle cell anemia is a condition in which red blood cells have an abnormal "sickle" shape. This disease can cause organ damage and chronic pain, and it can raise your risk of infection.  Sickle cell anemia is a genetic disorder.  Treatment focuses on managing your symptoms and preventing complications of the disease.  Get medical help right away if you have any signs of infection, such as a fever. This information is not intended to replace advice given to you by your health care provider. Make sure you discuss any questions you have with your health care provider. Document Revised: 10/09/2018 Document Reviewed: 05/30/2016 Elsevier Patient Education  2020 [employer] Information on my medicine - ELIQUIS (apixaban)  This medication education was reviewed with me or my healthcare representative as part of my discharge preparation.  The pharmacist that spoke with me during my hospital stay was:  Valentina GuChristy, Scott D, [facility]  Why was Eliquis prescribed for you? Eliquis was prescribed to treat blood clots that may have been found in the veins of your legs (deep vein thrombosis) or in your lungs (pulmonary embolism) and to reduce the risk of them occurring again.  What do You need to know about Eliquis ? The  starting dose is 10 mg (two 5 mg tablets) taken TWICE daily for the FIRST SEVEN (7) DAYS, then on (enter date)  05/22/19  the dose is reduced to ONE 5 mg tablet taken TWICE daily.  Eliquis may be taken with or without food.   Try to take the dose about the same time in the morning and in the evening. If you have difficulty swallowing the tablet whole please discuss with your pharmacist how to take the medication safely.  Take Eliquis exactly as prescribed and DO  NOT stop taking Eliquis without talking to the doctor who prescribed the medication.  Stopping may increase your risk of developing a new blood clot.  Refill your prescription before you run out.  After discharge, you should have regular check-up appointments with your healthcare provider that is prescribing your Eliquis.    What do you do if you miss a dose? If a dose of ELIQUIS is not taken at the scheduled time, take it as soon as possible on the same day and twice-daily administration should be resumed. The dose should not be doubled to make up for a missed dose.  Important Safety Information A possible side effect of Eliquis is bleeding. You should call your healthcare provider right away if you experience any of the following: ? Bleeding from an injury or your nose that does not stop. ? Unusual colored urine (red or dark brown) or unusual colored stools (red or black). ? Unusual bruising for unknown reasons. ? A serious fall or if you hit your head (even if there is no bleeding).  Some medicines may interact with Eliquis and might increase your risk of bleeding or clotting while on Eliquis. To help avoid this, consult your healthcare provider or pharmacist prior to using any new prescription or non-prescription medications, including herbals, vitamins, non-steroidal anti-inflammatory drugs (NSAIDs) and supplements.  This website has more information on Eliquis (apixaban): http://www.eliquis.com/eliquis/home

## 2020-05-16 NOTE — Progress Notes (Signed)
SATURATION QUALIFICATIONS: (This note is used to comply with regulatory documentation for home oxygen)  Patient Saturations on Room Air at Rest = 87%  Patient Saturations on Room Air while Ambulating = 85%  Patient Saturations on 4 Liters of oxygen while Ambulating = 92%  Please briefly explain why patient needs home oxygen:Pt requires supplemental  O2 at rest and while ambulating to maintain saturation at or above 92%

## 2020-05-16 NOTE — Plan of Care (Signed)
  Problem: Education: Goal: Knowledge of General Education information will improve Description: Including pain rating scale, medication(s)/side effects and non-pharmacologic comfort measures Outcome: Progressing   Problem: Coping: Goal: Level of anxiety will decrease Outcome: Progressing   Problem: Elimination: Goal: Will not experience complications related to urinary retention Outcome: Progressing   Problem: Pain Managment: Goal: General experience of comfort will improve Outcome: Progressing   Problem: Safety: Goal: Ability to remain free from injury will improve Outcome: Progressing   Problem: Skin Integrity: Goal: Risk for impaired skin integrity will decrease Outcome: Progressing   Problem: Phase I Progression Outcomes Goal: Pain controlled with appropriate interventions Outcome: Progressing Goal: Dyspnea controlled at rest (PE) Outcome: Progressing Goal: Voiding-avoid urinary catheter unless indicated Outcome: Progressing   Problem: Phase II Progression Outcomes Goal: Therapeutic drug levels for anticoagulation Outcome: Progressing   Problem: Discharge Progression Outcomes Goal: Pain controlled with appropriate interventions Outcome: Progressing   Problem: Sensory: Goal: Pain level will decrease with appropriate interventions Outcome: Progressing   Problem: Health Behavior: Goal: Postive changes in compliance with treatment and prescription regimens will improve Outcome: Progressing

## 2020-05-18 ENCOUNTER — Telehealth: Payer: Self-pay | Admitting: Family Medicine

## 2020-05-18 NOTE — Telephone Encounter (Signed)
Joel Leblanc is a 40 year old male with a history of sickle cell disease that was discharged from inpatient services on 05/16/2020. Patient was admitted with multiple subsegmental pulmonary emboli. Patient is complaining of excruciating generalized pain. Patient was discharged home with Eliquis and has been taking medication consistently.  He says that he has been unable to ambulate.  Pain persists despite home medications.   The patient was given clear instructions to go to ER if symptoms do not improve, worsen or new problems develop. The patient verbalized understanding.    Nolon Nations  APRN, MSN, FNP-C [facility] [address] [place], [place] [address] [phone]

## 2020-05-18 NOTE — Telephone Encounter (Signed)
Message routed to Massie Maroon, FNP . Please Advise.

## 2020-06-01 ENCOUNTER — Ambulatory Visit: Payer: Medicare Other | Admitting: Family Medicine

## 2020-07-07 ENCOUNTER — Other Ambulatory Visit (HOSPITAL_COMMUNITY): Payer: Medicare Other

## 2020-07-07 ENCOUNTER — Encounter (HOSPITAL_COMMUNITY): Payer: Self-pay | Admitting: Pulmonary Disease

## 2020-07-21 ENCOUNTER — Telehealth (HOSPITAL_COMMUNITY): Payer: Self-pay | Admitting: Pulmonary Disease

## 2020-07-21 NOTE — Telephone Encounter (Signed)
Just an FYI. We have made several attempts to contact this patient including sending a letter to schedule or reschedule their echocardiogram. We will be removing the patient from the echo WQ.   07/07/20 PT NO SHOWED-MAILED LETTER/LBW       Thank you

## 2020-10-09 ENCOUNTER — Other Ambulatory Visit: Payer: Self-pay | Admitting: Internal Medicine

## 2020-10-09 NOTE — Telephone Encounter (Signed)
   Notes to clinic Not a provider we approve rx for.  

## 2021-01-20 ENCOUNTER — Other Ambulatory Visit: Payer: Self-pay | Admitting: Internal Medicine

## 2021-01-20 NOTE — Telephone Encounter (Signed)
Provider not at this practice

## 2021-01-21 ENCOUNTER — Ambulatory Visit
Admission: EM | Admit: 2021-01-21 | Discharge: 2021-01-21 | Disposition: A | Discharge status: Home / Self Care | Payer: Medicare Other | Attending: Physician Assistant | Admitting: Physician Assistant

## 2021-01-21 ENCOUNTER — Other Ambulatory Visit: Payer: Self-pay

## 2021-01-21 ENCOUNTER — Encounter: Payer: Self-pay | Admitting: Emergency Medicine

## 2021-01-21 DIAGNOSIS — B349 Viral infection, unspecified: Secondary | ICD-10-CM | POA: Diagnosis not present

## 2021-01-21 DIAGNOSIS — J45909 Unspecified asthma, uncomplicated: Secondary | ICD-10-CM | POA: Insufficient documentation

## 2021-01-21 DIAGNOSIS — R059 Cough, unspecified: Secondary | ICD-10-CM | POA: Diagnosis present

## 2021-01-21 DIAGNOSIS — G4733 Obstructive sleep apnea (adult) (pediatric): Secondary | ICD-10-CM | POA: Diagnosis not present

## 2021-01-21 DIAGNOSIS — Z880 Allergy status to penicillin: Secondary | ICD-10-CM | POA: Diagnosis not present

## 2021-01-21 DIAGNOSIS — Z20822 Contact with and (suspected) exposure to covid-19: Secondary | ICD-10-CM | POA: Diagnosis not present

## 2021-01-21 DIAGNOSIS — R252 Cramp and spasm: Secondary | ICD-10-CM | POA: Diagnosis not present

## 2021-01-21 DIAGNOSIS — M791 Myalgia, unspecified site: Secondary | ICD-10-CM | POA: Diagnosis not present

## 2021-01-21 DIAGNOSIS — H9203 Otalgia, bilateral: Secondary | ICD-10-CM | POA: Diagnosis not present

## 2021-01-21 DIAGNOSIS — J029 Acute pharyngitis, unspecified: Secondary | ICD-10-CM | POA: Diagnosis not present

## 2021-01-21 DIAGNOSIS — Z7901 Long term (current) use of anticoagulants: Secondary | ICD-10-CM | POA: Insufficient documentation

## 2021-01-21 DIAGNOSIS — Z7951 Long term (current) use of inhaled steroids: Secondary | ICD-10-CM | POA: Insufficient documentation

## 2021-01-21 DIAGNOSIS — G894 Chronic pain syndrome: Secondary | ICD-10-CM | POA: Diagnosis not present

## 2021-01-21 DIAGNOSIS — Z86711 Personal history of pulmonary embolism: Secondary | ICD-10-CM | POA: Diagnosis not present

## 2021-01-21 DIAGNOSIS — Z79899 Other long term (current) drug therapy: Secondary | ICD-10-CM | POA: Insufficient documentation

## 2021-01-21 DIAGNOSIS — D5720 Sickle-cell/Hb-C disease without crisis: Secondary | ICD-10-CM | POA: Insufficient documentation

## 2021-01-21 DIAGNOSIS — R0981 Nasal congestion: Secondary | ICD-10-CM | POA: Diagnosis not present

## 2021-01-21 LAB — POCT RAPID STREP A: Streptococcus, Group A Screen (Direct): NEGATIVE

## 2021-01-21 MED ORDER — IPRATROPIUM BROMIDE 0.06 % NA SOLN: 2.0000 | Freq: Four times a day (QID) | NASAL | 0 refills | Status: AC

## 2021-01-21 MED ORDER — LIDOCAINE VISCOUS HCL 2 % MT SOLN: 15.0000 mL | OROMUCOSAL | 0 refills | Status: DC | PRN

## 2021-01-21 NOTE — ED Triage Notes (Signed)
Patient c/o sore throat that started 3 days ago.  Patient states that he has been having muscle cramps and pain in his legs.  Patient states that he has sickle cell disease.  Patient reports chills.  Patient took home covid test yesterday and was negative.
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
## 2021-01-21 NOTE — Discharge Instructions (Addendum)
URI/COLD SYMPTOMS: Rapid strep test is negative. Your exam today is consistent with a viral illness. Antibiotics are not indicated at this time. IF COVID IS POSITIVE WE WILL CALL IN ANTIVIRAL MEDICATION. Use medications as directed, including cough syrup, nasal saline, and decongestants. Your symptoms should improve over the next few days and resolve within 7-10 days. Increase rest and fluids. F/u if symptoms worsen or predominate such as sore throat, ear pain, productive cough, shortness of breath, or if you develop high fevers or worsening fatigue over the next several days.    IF BODY PAIN WORSENS OR YOU HAVE SHORTNESS OF BREATH OR CHEST PAIN GO TO ER FOR POSSIBLE SICKLE CELL CRISIS  You have received COVID testing today either for positive exposure, concerning symptoms that could be related to COVID infection, screening purposes, or re-testing after confirmed positive.  Your test obtained today checks for active viral infection in the last 1-2 weeks. If your test is negative now, you can still test positive later. So, if you do develop symptoms you should either get re-tested and/or isolate x 5 days and then strict mask use x 5 days (unvaccinated) or mask use x 10 days (vaccinated). Please follow CDC guidelines.  While Rapid antigen tests come back in 15-20 minutes, send out PCR/molecular test results typically come back within 1-3 days. In the mean time, if you are symptomatic, assume this could be a positive test and treat/monitor yourself as if you do have COVID.   We will call with test results if positive. Please download the MyChart app and set up a profile to access test results.   If symptomatic, go home and rest. Push fluids. Take Tylenol as needed for discomfort. Gargle warm salt water. Throat lozenges. Take Mucinex DM or Robitussin for cough. Humidifier in bedroom to ease coughing. Warm showers. Also review the COVID handout for more information.  COVID-19 INFECTION: The incubation period  of COVID-19 is approximately 14 days after exposure, with most symptoms developing in roughly 4-5 days. Symptoms may range in severity from mild to critically severe. Roughly 80% of those infected will have mild symptoms. People of any age may become infected with COVID-19 and have the ability to transmit the virus. The most common symptoms include: fever, fatigue, cough, body aches, headaches, sore throat, nasal congestion, shortness of breath, nausea, vomiting, diarrhea, changes in smell and/or taste.    COURSE OF ILLNESS Some patients may begin with mild disease which can progress quickly into critical symptoms. If your symptoms are worsening please call ahead to the Emergency Department and proceed there for further treatment. Recovery time appears to be roughly 1-2 weeks for mild symptoms and 3-6 weeks for severe disease.   GO IMMEDIATELY TO ER FOR FEVER YOU ARE UNABLE TO GET DOWN WITH TYLENOL, BREATHING PROBLEMS, CHEST PAIN, FATIGUE, LETHARGY, INABILITY TO EAT OR DRINK, ETC  QUARANTINE AND ISOLATION: To help decrease the spread of COVID-19 please remain isolated if you have COVID infection or are highly suspected to have COVID infection. This means -stay home and isolate to one room in the home if you live with others. Do not share a bed or bathroom with others while ill, sanitize and wipe down all countertops and keep common areas clean and disinfected. Stay home for 5 days. If you have no symptoms or your symptoms are resolving after 5 days, you can leave your house. Continue to wear a mask around others for 5 additional days. If you have been in close contact (within 6  feet) of someone diagnosed with COVID 19, you are advised to quarantine in your home for 14 days as symptoms can develop anywhere from 2-14 days after exposure to the virus. If you develop symptoms, you  must isolate.  Most current guidelines for COVID after exposure -unvaccinated: isolate 5 days and strict mask use x 5 days. Test  on day 5 is possible -vaccinated: wear mask x 10 days if symptoms do not develop -You do not necessarily need to be tested for COVID if you have + exposure and  develop symptoms. Just isolate at home x10 days from symptom onset During this global pandemic, CDC advises to practice social distancing, try to stay at least 97ft away from others at all times. Wear a face covering. Wash and sanitize your hands regularly and avoid going anywhere that is not necessary.  KEEP IN MIND THAT THE COVID TEST IS NOT 100% ACCURATE AND YOU SHOULD STILL DO EVERYTHING TO PREVENT POTENTIAL SPREAD OF VIRUS TO OTHERS (WEAR MASK, WEAR GLOVES, WASH HANDS AND SANITIZE REGULARLY). IF INITIAL TEST IS NEGATIVE, THIS MAY NOT MEAN YOU ARE DEFINITELY NEGATIVE. MOST ACCURATE TESTING IS DONE 5-7 DAYS AFTER EXPOSURE.   It is not advised by CDC to get re-tested after receiving a positive COVID test since you can still test positive for weeks to months after you have already cleared the virus.   *If you have not been vaccinated for COVID, I strongly suggest you consider getting vaccinated as long as there are no contraindications.

## 2021-01-22 LAB — SARS CORONAVIRUS 2 (TAT 6-24 HRS): SARS Coronavirus 2: NEGATIVE

## 2021-01-24 LAB — CULTURE, GROUP A STREP (THRC)

## 2021-06-19 ENCOUNTER — Encounter: Payer: Self-pay | Admitting: Radiology

## 2021-06-19 ENCOUNTER — Emergency Department
Admission: EM | Admit: 2021-06-19 | Discharge: 2021-06-19 | Disposition: A | Discharge status: Home / Self Care | Payer: Medicare Other | Attending: Emergency Medicine | Admitting: Emergency Medicine

## 2021-06-19 ENCOUNTER — Other Ambulatory Visit: Payer: Self-pay

## 2021-06-19 ENCOUNTER — Emergency Department: Payer: Medicare Other

## 2021-06-19 DIAGNOSIS — J45909 Unspecified asthma, uncomplicated: Secondary | ICD-10-CM | POA: Insufficient documentation

## 2021-06-19 DIAGNOSIS — R1013 Epigastric pain: Secondary | ICD-10-CM

## 2021-06-19 DIAGNOSIS — R109 Unspecified abdominal pain: Secondary | ICD-10-CM | POA: Diagnosis present

## 2021-06-19 LAB — COMPREHENSIVE METABOLIC PANEL
ALT: 26 U/L (ref 0–44)
AST: 31 U/L (ref 15–41)
Albumin: 4.3 g/dL (ref 3.5–5.0)
Alkaline Phosphatase: 71 U/L (ref 38–126)
Anion gap: 8 (ref 5–15)
BUN: 16 mg/dL (ref 6–20)
CO2: 25 mmol/L (ref 22–32)
Calcium: 9.5 mg/dL (ref 8.9–10.3)
Chloride: 106 mmol/L (ref 98–111)
Creatinine, Ser: 0.99 mg/dL (ref 0.61–1.24)
GFR, Estimated: >60 mL/min (ref >60)
Glucose, Bld: 139 mg/dL — ABNORMAL HIGH (ref 70–99)
Potassium: 3.7 mmol/L (ref 3.5–5.1)
Sodium: 139 mmol/L (ref 135–145)
Total Bilirubin: 1.3 mg/dL — ABNORMAL HIGH (ref 0.3–1.2)
Total Protein: 8.2 g/dL — ABNORMAL HIGH (ref 6.5–8.1)

## 2021-06-19 LAB — CBC WITH DIFFERENTIAL/PLATELET
Abs Immature Granulocytes: 0.04 10*3/uL (ref 0.00–0.07)
Basophils Absolute: 0.1 10*3/uL (ref 0.0–0.1)
Basophils Relative: 1 %
Eosinophils Absolute: 0.1 10*3/uL (ref 0.0–0.5)
Eosinophils Relative: 1 %
HCT: 36.2 % — ABNORMAL LOW (ref 39.0–52.0)
Hemoglobin: 12.3 g/dL — ABNORMAL LOW (ref 13.0–17.0)
Immature Granulocytes: 0 %
Lymphocytes Relative: 16 %
Lymphs Abs: 2.1 10*3/uL (ref 0.7–4.0)
MCH: 26.3 pg (ref 26.0–34.0)
MCHC: 34 g/dL (ref 30.0–36.0)
MCV: 77.4 fL — ABNORMAL LOW (ref 80.0–100.0)
Monocytes Absolute: 1 10*3/uL (ref 0.1–1.0)
Monocytes Relative: 7 %
Neutro Abs: 10.4 10*3/uL — ABNORMAL HIGH (ref 1.7–7.7)
Neutrophils Relative %: 75 %
Platelets: 488 10*3/uL — ABNORMAL HIGH (ref 150–400)
RBC: 4.68 MIL/uL (ref 4.22–5.81)
RDW: 15.2 % (ref 11.5–15.5)
WBC: 13.8 10*3/uL — ABNORMAL HIGH (ref 4.0–10.5)
nRBC: 0.1 % (ref 0.0–0.2)

## 2021-06-19 LAB — RETICULOCYTES
Immature Retic Fract: 29.7 % — ABNORMAL HIGH (ref 2.3–15.9)
RBC.: 4.67 MIL/uL (ref 4.22–5.81)
Retic Count, Absolute: 180.7 10*3/uL (ref 19.0–186.0)
Retic Ct Pct: 3.9 % — ABNORMAL HIGH (ref 0.4–3.1)

## 2021-06-19 LAB — TROPONIN I (HIGH SENSITIVITY): Troponin I (High Sensitivity): 7 ng/L (ref <18)

## 2021-06-19 LAB — LACTIC ACID, PLASMA: Lactic Acid, Venous: 1.6 mmol/L (ref 0.5–1.9)

## 2021-06-19 LAB — LIPASE, BLOOD: Lipase: 44 U/L (ref 11–51)

## 2021-06-19 MED ORDER — METOCLOPRAMIDE HCL 10 MG PO TABS: 10.0000 mg | ORAL_TABLET | Freq: Four times a day (QID) | ORAL | 0 refills | Status: AC | PRN

## 2021-06-19 MED ORDER — HYDROMORPHONE HCL 1 MG/ML IJ SOLN
2.0000 mg | Freq: Once | INTRAMUSCULAR | Status: AC
  Administered 2021-06-19: 2 mg via INTRAVENOUS
  Filled 2021-06-19: qty 2

## 2021-06-19 MED ORDER — ONDANSETRON HCL 4 MG/2ML IJ SOLN
4.0000 mg | Freq: Once | INTRAMUSCULAR | Status: AC
  Administered 2021-06-19: 4 mg via INTRAVENOUS
  Filled 2021-06-19: qty 2

## 2021-06-19 MED ORDER — ALUMINUM-MAGNESIUM-SIMETHICONE 200-200-20 MG/5ML PO SUSP: 30.0000 mL | Freq: Three times a day (TID) | ORAL | 0 refills | Status: AC

## 2021-06-19 MED ORDER — KETOROLAC TROMETHAMINE 30 MG/ML IJ SOLN
15.0000 mg | INTRAMUSCULAR | Status: AC
  Administered 2021-06-19: 15 mg via INTRAVENOUS
  Filled 2021-06-19: qty 1

## 2021-06-19 MED ORDER — IOHEXOL 300 MG/ML  SOLN
100.0000 mL | Freq: Once | INTRAMUSCULAR | Status: AC | PRN
  Administered 2021-06-19: 100 mL via INTRAVENOUS

## 2021-06-19 MED ORDER — ONDANSETRON 4 MG PO TBDP: 4.0000 mg | ORAL_TABLET | Freq: Three times a day (TID) | ORAL | 0 refills | Status: AC | PRN

## 2021-06-19 NOTE — ED Notes (Signed)
Pt states pain is back in his lower abdomen- pt states that the medicine he received earlier worked well, but it has worn off

## 2021-06-19 NOTE — ED Notes (Signed)
Pt given apple juice and ice per request

## 2021-06-19 NOTE — ED Notes (Signed)
Pt taken for CT 

## 2021-06-19 NOTE — ED Triage Notes (Signed)
Abdominal pain for 3 days.  Vomited tonight.

## 2021-06-19 NOTE — ED Notes (Signed)
Patient states he had Promethazine and that's it since his pain started this afternoon at 1500. He states he threw up 3 times but has had no diarrhea. Patient also states he does not remember the last meal that he had or what time.

## 2021-06-19 NOTE — Discharge Instructions (Signed)
Your labs and CT scan today were okay.  Continue taking all of your medications as prescribed, including Eliquis.  Continue taking your Dexilant for stomach acid.  We are adding additional medicines to help soothe your stomach to relieve the symptoms.

## 2021-06-19 NOTE — ED Notes (Signed)
Called lab to inquire about pt's lab work that was collected approximately 2 hrs ago- stated they would run labs now
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
## 2021-06-19 NOTE — ED Notes (Signed)
Dr Scotty Court at bedside to speak with pt

## 2021-06-19 NOTE — ED Notes (Signed)
Candace, RN informed this RN that pt was out in hallway c/o abd pain

## 2021-06-19 NOTE — ED Notes (Signed)
Pt social worker here to pick up pt- pt states he would like to leave d/t the staff not helping him and not caring about him- informed pt that his CT scan results were normal and his blood work did not indicate a crisis- explained to pt that the doctor did write him prescriptions to help with his nausea and abd pain at home- pt states he is going to [facility] for a second opinion

## 2021-06-19 NOTE — ED Provider Notes (Signed)
Procedures     ----------------------------------------- 11:37 AM on 06/19/2021 ----------------------------------------- CT abdomen pelvis viewed and interpreted by me, appears unremarkable.  No signs of bowel obstruction or abscess or free air.  Radiology report reviewed.  Patient does not require admission since patient is ambulatory, pain is improved.  He is tolerating oral intake, drinking lots of apple juice.  Suspect gastritis.  Stable for discharge.     Carrie Mew, MD 06/19/21 [phone]

## 2021-08-26 IMAGING — CT CT ANGIO CHEST
2 of 6 series · 18 of 36 positions shown · IV contrast (omnipaque)
Comparison: None.

CLINICAL DATA: Syncope.

EXAM:
CT ANGIOGRAPHY CHEST WITH CONTRAST
TECHNIQUE: Multidetector CT imaging of the chest was performed using the
standard protocol during bolus administration of intravenous
contrast. Multiplanar CT image reconstructions and MIPs were
obtained to evaluate the vascular anatomy.
CONTRAST:  100mL OMNIPAQUE IOHEXOL 350 MG/ML SOLN

[Series 5: thins · axial · 0.75mm/px · z∈[+1416,+1696]mm · 17 of 316 slices shown]
[im 18/316  lung]
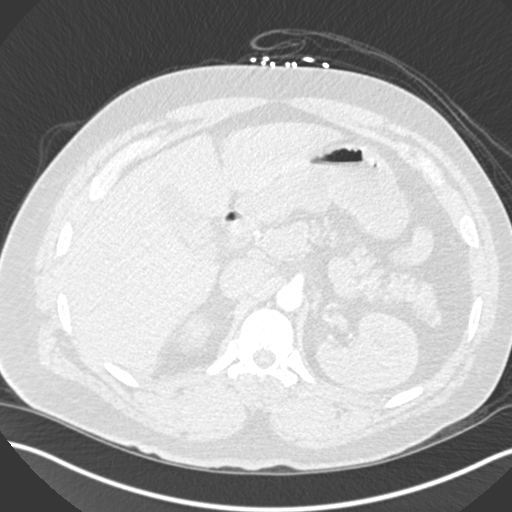
[im 36/316  mediastinal]
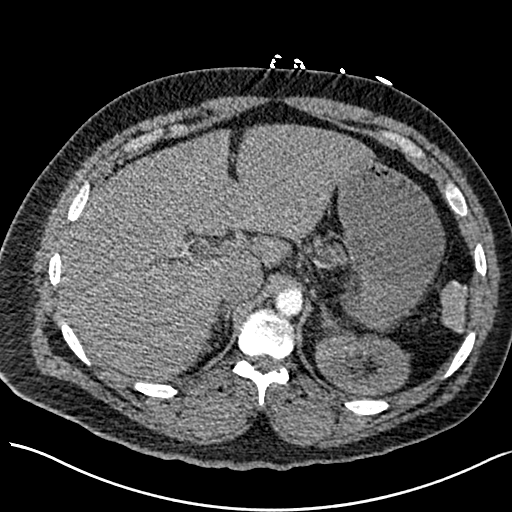
[im 53/316  lung]
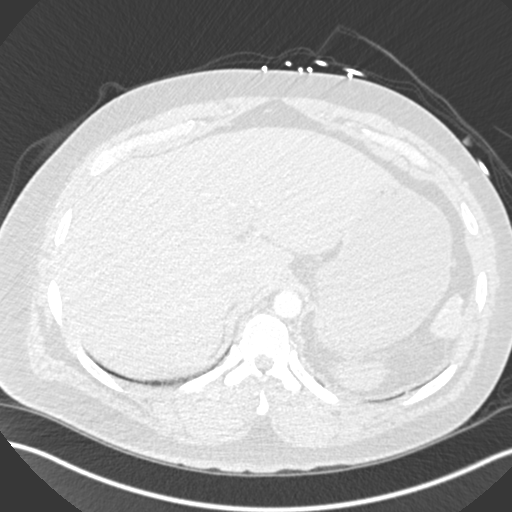
[im 71/316  mediastinal]
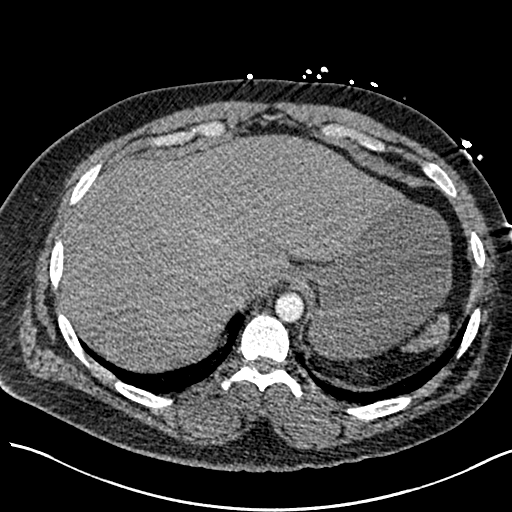
[im 88/316  lung]
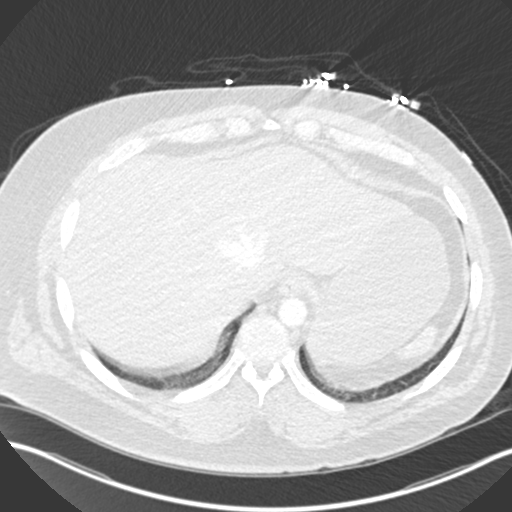
[im 106/316  mediastinal]
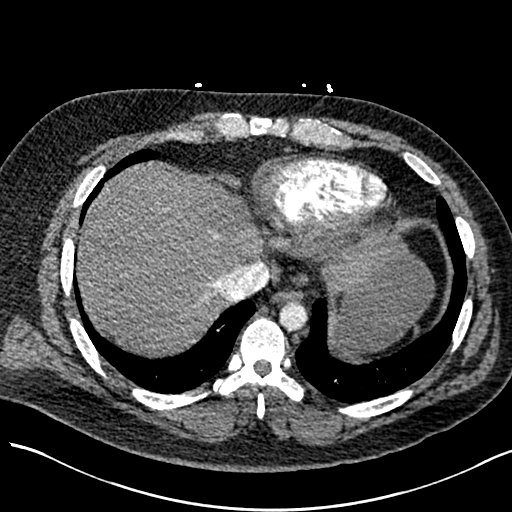
[im 123/316  lung]
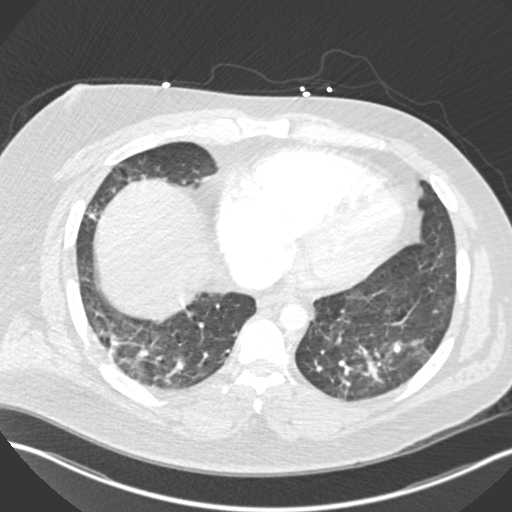
[im 141/316  mediastinal]
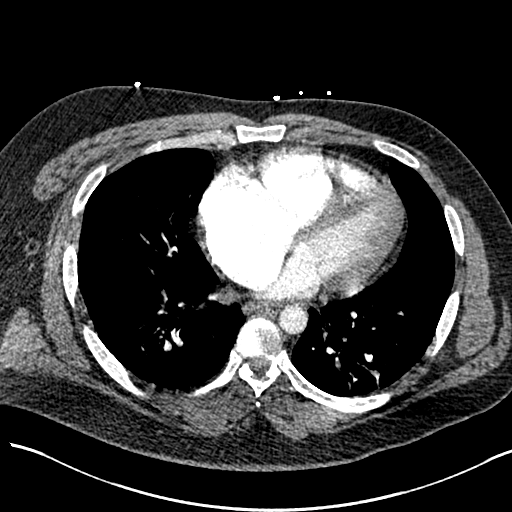
[im 158/316  lung]
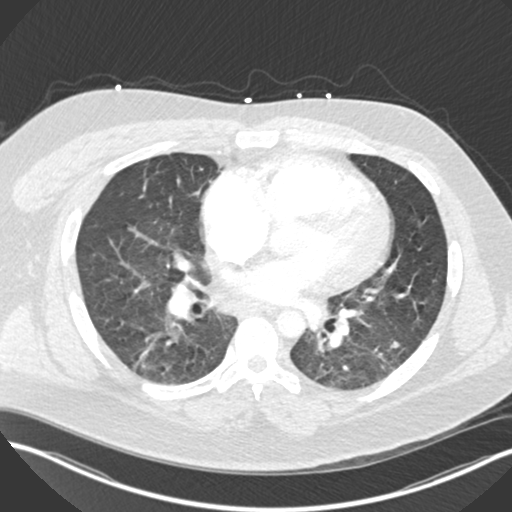
[im 176/316  mediastinal]
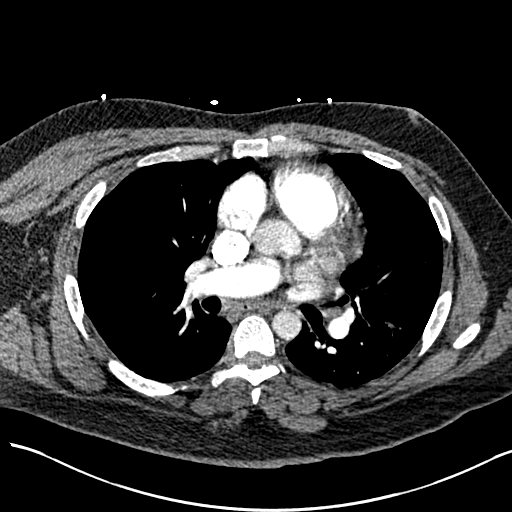
[im 193/316  lung]
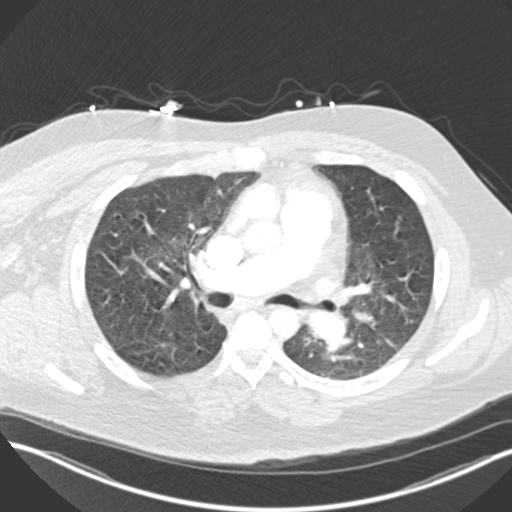
[im 211/316  mediastinal]
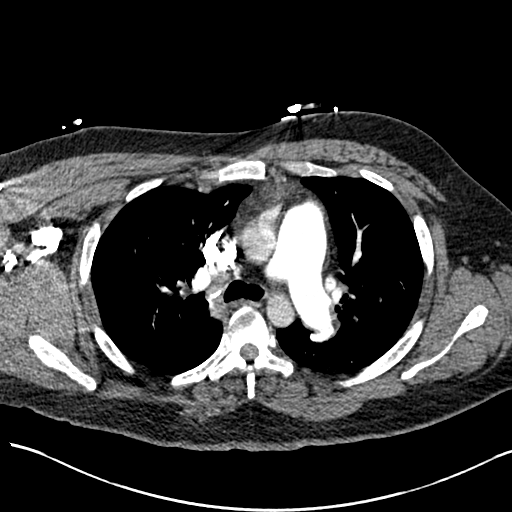
[im 228/316  lung]
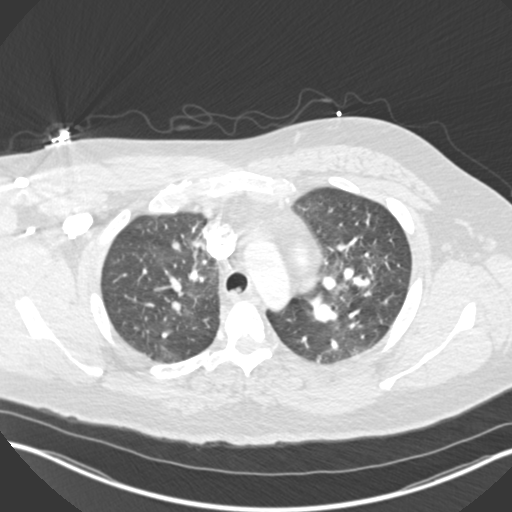
[im 246/316  mediastinal]
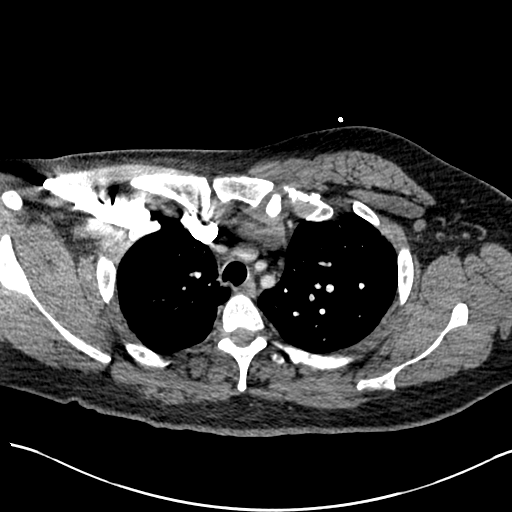
[im 263/316  lung]
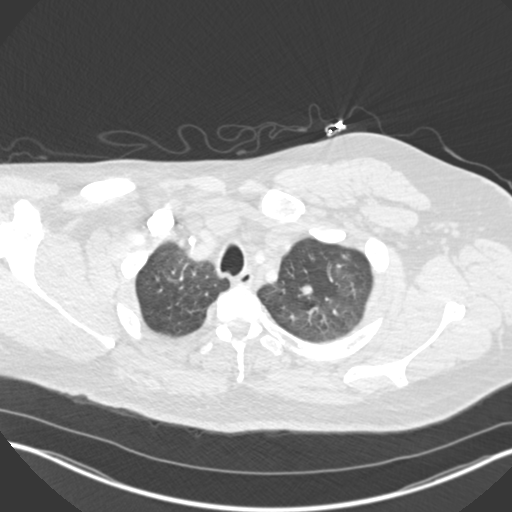
[im 281/316  mediastinal]
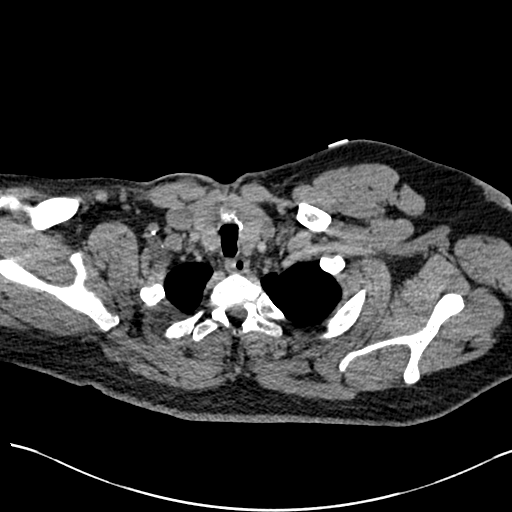
[im 298/316  lung]
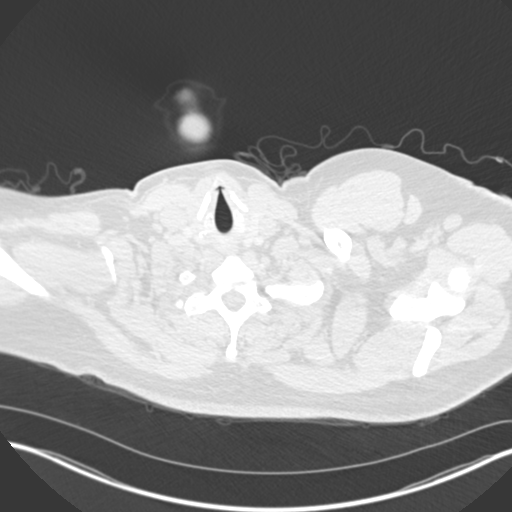

[Series 7: coronal mpr · coronal · 0.65mm/px · 1 of 151 slices shown]
[im 76/151  mediastinal]
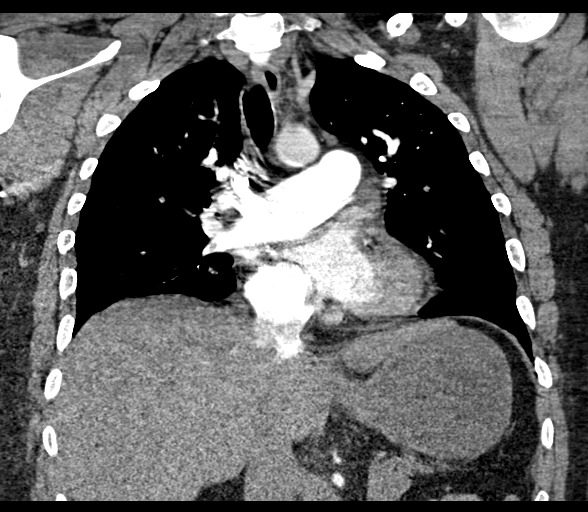

[18 of 36 positions shown; findings below may reference images not displayed]

FINDINGS: Cardiovascular: Moderate severity areas of intraluminal low
attenuation are seen within bilateral upper lobe, bilateral lower
lobe and right middle lobe branches of the pulmonary arteries, right
greater than left. Associated right heart strain is noted. The
cardiac size is otherwise normal. A trace amount of pericardial
fluid is noted.

Mediastinum/Nodes: No enlarged mediastinal, hilar, or axillary lymph
nodes. Thyroid gland, trachea, and esophagus demonstrate no
significant findings.

Lungs/Pleura: Mild areas of atelectasis and/or early infiltrate are
seen within the bilateral lung bases and along the anteromedial
aspects of the bilateral upper lobes.

There is no evidence of a pleural effusion or pneumothorax.

Upper Abdomen: A very small spleen is seen.

Musculoskeletal: No chest wall abnormality. No acute or significant
osseous findings.

Review of the MIP images confirms the above findings.
IMPRESSION: 1. Moderate severity bilateral pulmonary embolism, right greater
than left, with associated right heart strain.
2. Mild bilateral atelectasis and/or early infiltrate.
3. Very small spleen.

## 2021-10-07 DIAGNOSIS — Z79891 Long term (current) use of opiate analgesic: Secondary | ICD-10-CM | POA: Diagnosis not present

## 2021-10-07 DIAGNOSIS — D5700 Hb-SS disease with crisis, unspecified: Secondary | ICD-10-CM | POA: Diagnosis not present

## 2021-10-07 DIAGNOSIS — M791 Myalgia, unspecified site: Secondary | ICD-10-CM | POA: Diagnosis not present

## 2021-10-07 DIAGNOSIS — G894 Chronic pain syndrome: Secondary | ICD-10-CM | POA: Diagnosis not present

## 2021-10-07 DIAGNOSIS — Z5181 Encounter for therapeutic drug level monitoring: Secondary | ICD-10-CM | POA: Diagnosis not present

## 2021-10-07 DIAGNOSIS — M792 Neuralgia and neuritis, unspecified: Secondary | ICD-10-CM | POA: Diagnosis not present

## 2021-10-19 DIAGNOSIS — R897 Abnormal histological findings in specimens from other organs, systems and tissues: Secondary | ICD-10-CM | POA: Diagnosis not present

## 2021-10-19 DIAGNOSIS — K219 Gastro-esophageal reflux disease without esophagitis: Secondary | ICD-10-CM | POA: Diagnosis not present

## 2021-10-19 DIAGNOSIS — K5903 Drug induced constipation: Secondary | ICD-10-CM | POA: Diagnosis not present

## 2021-10-21 DIAGNOSIS — Z9989 Dependence on other enabling machines and devices: Secondary | ICD-10-CM | POA: Diagnosis not present

## 2021-10-21 DIAGNOSIS — G4733 Obstructive sleep apnea (adult) (pediatric): Secondary | ICD-10-CM | POA: Diagnosis not present

## 2021-10-21 DIAGNOSIS — D5700 Hb-SS disease with crisis, unspecified: Secondary | ICD-10-CM | POA: Diagnosis not present

## 2021-10-21 DIAGNOSIS — R4189 Other symptoms and signs involving cognitive functions and awareness: Secondary | ICD-10-CM | POA: Diagnosis not present

## 2021-10-21 DIAGNOSIS — G43719 Chronic migraine without aura, intractable, without status migrainosus: Secondary | ICD-10-CM | POA: Diagnosis not present

## 2021-10-24 ENCOUNTER — Other Ambulatory Visit: Payer: Self-pay | Admitting: Student

## 2021-10-24 DIAGNOSIS — R4189 Other symptoms and signs involving cognitive functions and awareness: Secondary | ICD-10-CM

## 2021-11-02 ENCOUNTER — Inpatient Hospital Stay: Admission: RE | Admit: 2021-11-02 | Payer: Medicare Other | Source: Ambulatory Visit

## 2021-11-14 DIAGNOSIS — G8929 Other chronic pain: Secondary | ICD-10-CM | POA: Diagnosis not present

## 2021-11-14 DIAGNOSIS — D5720 Sickle-cell/Hb-C disease without crisis: Secondary | ICD-10-CM | POA: Diagnosis not present

## 2021-11-14 DIAGNOSIS — D571 Sickle-cell disease without crisis: Secondary | ICD-10-CM | POA: Diagnosis not present

## 2021-11-14 DIAGNOSIS — R809 Proteinuria, unspecified: Secondary | ICD-10-CM | POA: Diagnosis not present

## 2021-11-14 DIAGNOSIS — I2699 Other pulmonary embolism without acute cor pulmonale: Secondary | ICD-10-CM | POA: Diagnosis not present

## 2021-11-14 DIAGNOSIS — E663 Overweight: Secondary | ICD-10-CM | POA: Diagnosis not present

## 2021-11-14 DIAGNOSIS — E559 Vitamin D deficiency, unspecified: Secondary | ICD-10-CM | POA: Diagnosis not present

## 2021-11-14 DIAGNOSIS — Z7901 Long term (current) use of anticoagulants: Secondary | ICD-10-CM | POA: Diagnosis not present

## 2021-11-14 DIAGNOSIS — R030 Elevated blood-pressure reading, without diagnosis of hypertension: Secondary | ICD-10-CM | POA: Diagnosis not present

## 2021-11-14 DIAGNOSIS — R0600 Dyspnea, unspecified: Secondary | ICD-10-CM | POA: Diagnosis not present

## 2021-11-14 DIAGNOSIS — D649 Anemia, unspecified: Secondary | ICD-10-CM | POA: Diagnosis not present

## 2021-11-14 DIAGNOSIS — Z79899 Other long term (current) drug therapy: Secondary | ICD-10-CM | POA: Diagnosis not present

## 2021-11-14 DIAGNOSIS — R7309 Other abnormal glucose: Secondary | ICD-10-CM | POA: Diagnosis not present

## 2021-11-15 DIAGNOSIS — Q445 Other congenital malformations of bile ducts: Secondary | ICD-10-CM | POA: Diagnosis not present

## 2021-11-15 DIAGNOSIS — Q441 Other congenital malformations of gallbladder: Secondary | ICD-10-CM | POA: Diagnosis not present

## 2021-11-15 DIAGNOSIS — D5700 Hb-SS disease with crisis, unspecified: Secondary | ICD-10-CM | POA: Diagnosis not present

## 2021-12-27 DIAGNOSIS — M21371 Foot drop, right foot: Secondary | ICD-10-CM | POA: Diagnosis not present

## 2022-01-04 DIAGNOSIS — I2699 Other pulmonary embolism without acute cor pulmonale: Secondary | ICD-10-CM | POA: Diagnosis not present

## 2022-01-04 DIAGNOSIS — H43391 Other vitreous opacities, right eye: Secondary | ICD-10-CM | POA: Diagnosis not present

## 2022-01-04 DIAGNOSIS — L309 Dermatitis, unspecified: Secondary | ICD-10-CM | POA: Diagnosis not present

## 2022-01-04 DIAGNOSIS — D5700 Hb-SS disease with crisis, unspecified: Secondary | ICD-10-CM | POA: Diagnosis not present

## 2022-01-04 DIAGNOSIS — J4540 Moderate persistent asthma, uncomplicated: Secondary | ICD-10-CM | POA: Diagnosis not present

## 2022-01-13 DIAGNOSIS — H5213 Myopia, bilateral: Secondary | ICD-10-CM | POA: Diagnosis not present

## 2022-01-13 DIAGNOSIS — H3523 Other non-diabetic proliferative retinopathy, bilateral: Secondary | ICD-10-CM | POA: Diagnosis not present

## 2022-01-17 DIAGNOSIS — E663 Overweight: Secondary | ICD-10-CM | POA: Diagnosis not present

## 2022-01-17 DIAGNOSIS — Z7901 Long term (current) use of anticoagulants: Secondary | ICD-10-CM | POA: Diagnosis not present

## 2022-01-17 DIAGNOSIS — I2699 Other pulmonary embolism without acute cor pulmonale: Secondary | ICD-10-CM | POA: Diagnosis not present

## 2022-01-17 DIAGNOSIS — E559 Vitamin D deficiency, unspecified: Secondary | ICD-10-CM | POA: Diagnosis not present

## 2022-01-17 DIAGNOSIS — R809 Proteinuria, unspecified: Secondary | ICD-10-CM | POA: Diagnosis not present

## 2022-01-17 DIAGNOSIS — D5720 Sickle-cell/Hb-C disease without crisis: Secondary | ICD-10-CM | POA: Diagnosis not present

## 2022-01-17 DIAGNOSIS — I10 Essential (primary) hypertension: Secondary | ICD-10-CM | POA: Diagnosis not present

## 2022-01-17 DIAGNOSIS — D649 Anemia, unspecified: Secondary | ICD-10-CM | POA: Diagnosis not present

## 2022-01-17 DIAGNOSIS — M21371 Foot drop, right foot: Secondary | ICD-10-CM | POA: Diagnosis not present

## 2022-01-17 DIAGNOSIS — N411 Chronic prostatitis: Secondary | ICD-10-CM | POA: Diagnosis not present

## 2022-01-17 DIAGNOSIS — R5383 Other fatigue: Secondary | ICD-10-CM | POA: Diagnosis not present

## 2022-01-17 DIAGNOSIS — R030 Elevated blood-pressure reading, without diagnosis of hypertension: Secondary | ICD-10-CM | POA: Diagnosis not present

## 2022-01-23 DIAGNOSIS — Z79891 Long term (current) use of opiate analgesic: Secondary | ICD-10-CM | POA: Diagnosis not present

## 2022-01-23 DIAGNOSIS — G894 Chronic pain syndrome: Secondary | ICD-10-CM | POA: Diagnosis not present

## 2022-01-23 DIAGNOSIS — F1190 Opioid use, unspecified, uncomplicated: Secondary | ICD-10-CM | POA: Diagnosis not present

## 2022-01-23 DIAGNOSIS — M791 Myalgia, unspecified site: Secondary | ICD-10-CM | POA: Diagnosis not present

## 2022-01-23 DIAGNOSIS — D5700 Hb-SS disease with crisis, unspecified: Secondary | ICD-10-CM | POA: Diagnosis not present

## 2022-01-23 DIAGNOSIS — M792 Neuralgia and neuritis, unspecified: Secondary | ICD-10-CM | POA: Diagnosis not present

## 2022-01-25 DIAGNOSIS — F1190 Opioid use, unspecified, uncomplicated: Secondary | ICD-10-CM | POA: Diagnosis not present

## 2022-02-20 DIAGNOSIS — G43719 Chronic migraine without aura, intractable, without status migrainosus: Secondary | ICD-10-CM | POA: Diagnosis not present

## 2022-02-20 DIAGNOSIS — M5416 Radiculopathy, lumbar region: Secondary | ICD-10-CM | POA: Diagnosis not present

## 2022-02-20 DIAGNOSIS — R4189 Other symptoms and signs involving cognitive functions and awareness: Secondary | ICD-10-CM | POA: Diagnosis not present

## 2022-02-20 DIAGNOSIS — D5700 Hb-SS disease with crisis, unspecified: Secondary | ICD-10-CM | POA: Diagnosis not present

## 2022-03-08 DIAGNOSIS — J4520 Mild intermittent asthma, uncomplicated: Secondary | ICD-10-CM | POA: Diagnosis not present

## 2022-03-08 DIAGNOSIS — R0683 Snoring: Secondary | ICD-10-CM | POA: Diagnosis not present

## 2022-03-08 DIAGNOSIS — I2699 Other pulmonary embolism without acute cor pulmonale: Secondary | ICD-10-CM | POA: Diagnosis not present

## 2022-03-08 DIAGNOSIS — D5720 Sickle-cell/Hb-C disease without crisis: Secondary | ICD-10-CM | POA: Diagnosis not present

## 2022-03-27 DIAGNOSIS — G8929 Other chronic pain: Secondary | ICD-10-CM | POA: Diagnosis not present

## 2022-03-27 DIAGNOSIS — D5700 Hb-SS disease with crisis, unspecified: Secondary | ICD-10-CM | POA: Diagnosis not present

## 2022-03-27 DIAGNOSIS — M545 Low back pain, unspecified: Secondary | ICD-10-CM | POA: Diagnosis not present
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
## 2022-04-20 ENCOUNTER — Ambulatory Visit (INDEPENDENT_AMBULATORY_CARE_PROVIDER_SITE_OTHER): Payer: Medicare HMO | Admitting: Nurse Practitioner

## 2022-04-20 ENCOUNTER — Encounter: Payer: Self-pay | Admitting: Nurse Practitioner

## 2022-04-20 VITALS — BP 123/86 | HR 78 | Temp 99.0°F | Ht 71.0 in | Wt 262.8 lb

## 2022-04-20 DIAGNOSIS — J4540 Moderate persistent asthma, uncomplicated: Secondary | ICD-10-CM

## 2022-04-20 DIAGNOSIS — Z7689 Persons encountering health services in other specified circumstances: Secondary | ICD-10-CM | POA: Diagnosis not present

## 2022-04-20 DIAGNOSIS — D5700 Hb-SS disease with crisis, unspecified: Secondary | ICD-10-CM

## 2022-04-20 DIAGNOSIS — D5720 Sickle-cell/Hb-C disease without crisis: Secondary | ICD-10-CM | POA: Diagnosis not present

## 2022-04-20 DIAGNOSIS — F331 Major depressive disorder, recurrent, moderate: Secondary | ICD-10-CM

## 2022-04-20 DIAGNOSIS — G894 Chronic pain syndrome: Secondary | ICD-10-CM | POA: Diagnosis not present

## 2022-04-20 DIAGNOSIS — L309 Dermatitis, unspecified: Secondary | ICD-10-CM

## 2022-04-20 DIAGNOSIS — E559 Vitamin D deficiency, unspecified: Secondary | ICD-10-CM | POA: Diagnosis not present

## 2022-04-20 MED ORDER — TRIAMCINOLONE ACETONIDE 0.1 % EX CREA: 1.0000 | TOPICAL_CREAM | Freq: Two times a day (BID) | CUTANEOUS | 0 refills | Status: AC

## 2022-04-20 NOTE — Assessment & Plan Note (Signed)
Chronic. Followed by Hematology.  On Folic Acid.  Continue collaborate with Hematologist.

## 2022-04-20 NOTE — Assessment & Plan Note (Addendum)
Chronic. Ongoing.  Currently on Trazodone. Will reassess at next visit and discuss further medications at that time.  Denies SI.

## 2022-04-20 NOTE — Assessment & Plan Note (Signed)
Chronic. Followed by Pain management. He is on Lyrica, Oxycodone PRN, Flexeril, Methadone.  Continue to collaborate with pain management.

## 2022-04-20 NOTE — Assessment & Plan Note (Signed)
Will check labs at next visit.   

## 2022-04-20 NOTE — Assessment & Plan Note (Signed)
Chronic. Well controlled.  Patient has previously been seen by Pulmonology but most recently has been managed by PCP.  Doing well with PRN albuterol and daily spiriva.  Follow up in 1 month.  Call sooner if concerns arise.

## 2022-04-24 DIAGNOSIS — M792 Neuralgia and neuritis, unspecified: Secondary | ICD-10-CM | POA: Diagnosis not present

## 2022-04-24 DIAGNOSIS — Z5181 Encounter for therapeutic drug level monitoring: Secondary | ICD-10-CM | POA: Diagnosis not present

## 2022-04-24 DIAGNOSIS — M791 Myalgia, unspecified site: Secondary | ICD-10-CM | POA: Diagnosis not present

## 2022-04-24 DIAGNOSIS — G894 Chronic pain syndrome: Secondary | ICD-10-CM | POA: Diagnosis not present

## 2022-04-24 DIAGNOSIS — M5416 Radiculopathy, lumbar region: Secondary | ICD-10-CM | POA: Diagnosis not present

## 2022-04-24 DIAGNOSIS — D5700 Hb-SS disease with crisis, unspecified: Secondary | ICD-10-CM | POA: Diagnosis not present

## 2022-04-24 DIAGNOSIS — Z79891 Long term (current) use of opiate analgesic: Secondary | ICD-10-CM | POA: Diagnosis not present

## 2022-04-24 DIAGNOSIS — F1190 Opioid use, unspecified, uncomplicated: Secondary | ICD-10-CM | POA: Diagnosis not present

## 2022-04-24 DIAGNOSIS — M542 Cervicalgia: Secondary | ICD-10-CM | POA: Diagnosis not present

## 2022-05-11 DIAGNOSIS — I10 Essential (primary) hypertension: Secondary | ICD-10-CM | POA: Diagnosis not present

## 2022-05-11 DIAGNOSIS — Z79899 Other long term (current) drug therapy: Secondary | ICD-10-CM | POA: Diagnosis not present

## 2022-05-11 DIAGNOSIS — D649 Anemia, unspecified: Secondary | ICD-10-CM | POA: Diagnosis not present

## 2022-05-11 DIAGNOSIS — E559 Vitamin D deficiency, unspecified: Secondary | ICD-10-CM | POA: Diagnosis not present

## 2022-05-11 DIAGNOSIS — D5720 Sickle-cell/Hb-C disease without crisis: Secondary | ICD-10-CM | POA: Diagnosis not present

## 2022-05-11 DIAGNOSIS — J4540 Moderate persistent asthma, uncomplicated: Secondary | ICD-10-CM | POA: Diagnosis not present

## 2022-05-11 DIAGNOSIS — D57219 Sickle-cell/Hb-C disease with crisis, unspecified: Secondary | ICD-10-CM | POA: Diagnosis not present

## 2022-05-11 DIAGNOSIS — D571 Sickle-cell disease without crisis: Secondary | ICD-10-CM | POA: Diagnosis not present

## 2022-05-11 DIAGNOSIS — I2699 Other pulmonary embolism without acute cor pulmonale: Secondary | ICD-10-CM | POA: Diagnosis not present

## 2022-05-11 DIAGNOSIS — R809 Proteinuria, unspecified: Secondary | ICD-10-CM | POA: Diagnosis not present

## 2022-05-11 DIAGNOSIS — Z7901 Long term (current) use of anticoagulants: Secondary | ICD-10-CM | POA: Diagnosis not present

## 2022-05-15 DIAGNOSIS — Z0100 Encounter for examination of eyes and vision without abnormal findings: Secondary | ICD-10-CM | POA: Diagnosis not present

## 2022-05-22 ENCOUNTER — Encounter: Payer: Medicare HMO | Admitting: Nurse Practitioner
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
## 2022-06-14 DIAGNOSIS — G894 Chronic pain syndrome: Secondary | ICD-10-CM | POA: Diagnosis not present

## 2022-06-14 DIAGNOSIS — F1190 Opioid use, unspecified, uncomplicated: Secondary | ICD-10-CM | POA: Diagnosis not present

## 2022-06-14 DIAGNOSIS — D5720 Sickle-cell/Hb-C disease without crisis: Secondary | ICD-10-CM | POA: Diagnosis not present

## 2022-06-28 DIAGNOSIS — Z79899 Other long term (current) drug therapy: Secondary | ICD-10-CM | POA: Diagnosis not present

## 2022-06-28 DIAGNOSIS — G894 Chronic pain syndrome: Secondary | ICD-10-CM | POA: Diagnosis not present

## 2022-06-28 DIAGNOSIS — Z79891 Long term (current) use of opiate analgesic: Secondary | ICD-10-CM | POA: Diagnosis not present

## 2022-06-28 DIAGNOSIS — D5720 Sickle-cell/Hb-C disease without crisis: Secondary | ICD-10-CM | POA: Diagnosis not present

## 2022-07-11 DIAGNOSIS — N528 Other male erectile dysfunction: Secondary | ICD-10-CM | POA: Diagnosis not present

## 2022-07-11 DIAGNOSIS — J4540 Moderate persistent asthma, uncomplicated: Secondary | ICD-10-CM | POA: Diagnosis not present

## 2022-07-11 DIAGNOSIS — I272 Pulmonary hypertension, unspecified: Secondary | ICD-10-CM | POA: Diagnosis not present

## 2022-07-11 DIAGNOSIS — D638 Anemia in other chronic diseases classified elsewhere: Secondary | ICD-10-CM | POA: Diagnosis not present

## 2022-07-11 DIAGNOSIS — E559 Vitamin D deficiency, unspecified: Secondary | ICD-10-CM | POA: Diagnosis not present

## 2022-07-11 DIAGNOSIS — D5720 Sickle-cell/Hb-C disease without crisis: Secondary | ICD-10-CM | POA: Diagnosis not present

## 2022-07-11 DIAGNOSIS — G894 Chronic pain syndrome: Secondary | ICD-10-CM | POA: Diagnosis not present

## 2022-07-11 DIAGNOSIS — G4733 Obstructive sleep apnea (adult) (pediatric): Secondary | ICD-10-CM | POA: Diagnosis not present

## 2022-07-11 DIAGNOSIS — R739 Hyperglycemia, unspecified: Secondary | ICD-10-CM | POA: Diagnosis not present

## 2022-07-11 DIAGNOSIS — R030 Elevated blood-pressure reading, without diagnosis of hypertension: Secondary | ICD-10-CM | POA: Diagnosis not present

## 2022-07-19 DIAGNOSIS — G894 Chronic pain syndrome: Secondary | ICD-10-CM | POA: Diagnosis not present

## 2022-07-19 DIAGNOSIS — M791 Myalgia, unspecified site: Secondary | ICD-10-CM | POA: Diagnosis not present

## 2022-07-19 DIAGNOSIS — M792 Neuralgia and neuritis, unspecified: Secondary | ICD-10-CM | POA: Diagnosis not present

## 2022-07-19 DIAGNOSIS — D5700 Hb-SS disease with crisis, unspecified: Secondary | ICD-10-CM | POA: Diagnosis not present

## 2022-07-21 ENCOUNTER — Encounter: Payer: Self-pay | Admitting: Nurse Practitioner

## 2022-07-21 ENCOUNTER — Ambulatory Visit (INDEPENDENT_AMBULATORY_CARE_PROVIDER_SITE_OTHER): Payer: Medicare HMO | Admitting: Nurse Practitioner

## 2022-07-21 VITALS — BP 132/80 | HR 98 | Temp 98.8°F | Ht 71.0 in | Wt 253.7 lb

## 2022-07-21 DIAGNOSIS — E559 Vitamin D deficiency, unspecified: Secondary | ICD-10-CM | POA: Diagnosis not present

## 2022-07-21 DIAGNOSIS — F331 Major depressive disorder, recurrent, moderate: Secondary | ICD-10-CM | POA: Diagnosis not present

## 2022-07-21 DIAGNOSIS — Z7189 Other specified counseling: Secondary | ICD-10-CM | POA: Diagnosis not present

## 2022-07-21 DIAGNOSIS — Z0000 Encounter for general adult medical examination without abnormal findings: Secondary | ICD-10-CM | POA: Diagnosis not present

## 2022-07-21 DIAGNOSIS — Z136 Encounter for screening for cardiovascular disorders: Secondary | ICD-10-CM | POA: Diagnosis not present

## 2022-07-21 LAB — URINALYSIS, ROUTINE W REFLEX MICROSCOPIC
Glucose, UA: NEGATIVE
Ketones, UA: NEGATIVE
Leukocytes,UA: NEGATIVE
Nitrite, UA: NEGATIVE
RBC, UA: NEGATIVE
Specific Gravity, UA: 1.015 (ref 1.005–1.030)
Urobilinogen, Ur: 1 mg/dL (ref 0.2–1.0)
pH, UA: 6.5 (ref 5.0–7.5)

## 2022-07-21 LAB — MICROSCOPIC EXAMINATION
Bacteria, UA: NONE SEEN
Epithelial Cells (non renal): NONE SEEN /hpf (ref 0–10)
RBC, Urine: NONE SEEN /hpf (ref 0–2)

## 2022-07-21 MED ORDER — APIXABAN 5 MG PO TABS: 5.0000 mg | ORAL_TABLET | Freq: Two times a day (BID) | ORAL | 1 refills | Status: AC
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
## 2022-07-21 NOTE — Assessment & Plan Note (Signed)
A voluntary discussion about advance care planning including the explanation and discussion of advance directives was extensively discussed  with the patient for 5 minutes with patient.  Explanation about the health care proxy and Living will was reviewed and packet with forms with explanation of how to fill them out was given.  During this discussion, the patient plans to fill out the paperwork required.  Patient was offered a separate [place] visit for further assistance with forms.
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
## 2022-07-21 NOTE — Assessment & Plan Note (Signed)
Labs ordered at visit today.  Will make recommendations based on lab results.   

## 2022-07-21 NOTE — Assessment & Plan Note (Signed)
Chronic. Not well controlled.  New referral placed for psychiatry.  Denies SI.  Follow up in 6 months.  Call sooner if concerns arise.

## 2022-07-22 LAB — COMPREHENSIVE METABOLIC PANEL
ALT: 23 IU/L (ref 0–44)
AST: 27 IU/L (ref 0–40)
Albumin/Globulin Ratio: 1.2 (ref 1.2–2.2)
Albumin: 4 g/dL — ABNORMAL LOW (ref 4.1–5.1)
Alkaline Phosphatase: 83 IU/L (ref 44–121)
BUN/Creatinine Ratio: 9 (ref 9–20)
BUN: 9 mg/dL (ref 6–24)
Bilirubin Total: 1.2 mg/dL (ref 0.0–1.2)
CO2: 23 mmol/L (ref 20–29)
Calcium: 9.7 mg/dL (ref 8.7–10.2)
Chloride: 105 mmol/L (ref 96–106)
Creatinine, Ser: 0.96 mg/dL (ref 0.76–1.27)
Globulin, Total: 3.4 g/dL (ref 1.5–4.5)
Glucose: 108 mg/dL — ABNORMAL HIGH (ref 70–99)
Potassium: 4.2 mmol/L (ref 3.5–5.2)
Sodium: 141 mmol/L (ref 134–144)
Total Protein: 7.4 g/dL (ref 6.0–8.5)
eGFR: 101 mL/min/{1.73_m2} (ref >59)

## 2022-07-22 LAB — TSH: TSH: 1.39 u[IU]/mL (ref 0.450–4.500)

## 2022-07-22 LAB — CBC WITH DIFFERENTIAL/PLATELET
Basophils Absolute: 0.1 10*3/uL (ref 0.0–0.2)
Basos: 1 %
EOS (ABSOLUTE): 0.5 10*3/uL — ABNORMAL HIGH (ref 0.0–0.4)
Eos: 4 %
Hematocrit: 36.1 % — ABNORMAL LOW (ref 37.5–51.0)
Hemoglobin: 12 g/dL — ABNORMAL LOW (ref 13.0–17.7)
Immature Grans (Abs): 0 10*3/uL (ref 0.0–0.1)
Immature Granulocytes: 0 %
Lymphocytes Absolute: 3.3 10*3/uL — ABNORMAL HIGH (ref 0.7–3.1)
Lymphs: 28 %
MCH: 26.5 pg — ABNORMAL LOW (ref 26.6–33.0)
MCHC: 33.2 g/dL (ref 31.5–35.7)
MCV: 80 fL (ref 79–97)
Monocytes Absolute: 0.9 10*3/uL (ref 0.1–0.9)
Monocytes: 8 %
NRBC: 1 % — ABNORMAL HIGH (ref 0–0)
Neutrophils Absolute: 7.1 10*3/uL — ABNORMAL HIGH (ref 1.4–7.0)
Neutrophils: 59 %
Platelets: 425 10*3/uL (ref 150–450)
RBC: 4.52 x10E6/uL (ref 4.14–5.80)
RDW: 15.7 % — ABNORMAL HIGH (ref 11.6–15.4)
WBC: 11.9 10*3/uL — ABNORMAL HIGH (ref 3.4–10.8)

## 2022-07-22 LAB — LIPID PANEL
Chol/HDL Ratio: 5 ratio (ref 0.0–5.0)
Cholesterol, Total: 145 mg/dL (ref 100–199)
HDL: 29 mg/dL — ABNORMAL LOW (ref >39)
LDL Chol Calc (NIH): 84 mg/dL (ref 0–99)
Triglycerides: 189 mg/dL — ABNORMAL HIGH (ref 0–149)
VLDL Cholesterol Cal: 32 mg/dL (ref 5–40)

## 2022-07-22 LAB — VITAMIN D 25 HYDROXY (VIT D DEFICIENCY, FRACTURES): Vit D, 25-Hydroxy: 28.1 ng/mL — ABNORMAL LOW (ref 30.0–100.0)

## 2022-07-24 NOTE — Progress Notes (Signed)
HI Joel Leblanc. It was nice to see you yesterday.  Your lab work looks good.  Your complete blood count is stable.  Continue to follow up with Hematology.  Your Vitamin D is low.  I recommend vitamin D 1000 IU daily.  This can be purchased over the counter.  No other concerns at this time. Continue with your current medication regimen.  Follow up as discussed.  Please let me know if you have any questions.

## 2022-08-30 DIAGNOSIS — Z79899 Other long term (current) drug therapy: Secondary | ICD-10-CM | POA: Diagnosis not present

## 2022-08-30 DIAGNOSIS — F1190 Opioid use, unspecified, uncomplicated: Secondary | ICD-10-CM | POA: Diagnosis not present

## 2022-08-30 DIAGNOSIS — D5720 Sickle-cell/Hb-C disease without crisis: Secondary | ICD-10-CM | POA: Diagnosis not present

## 2022-08-30 DIAGNOSIS — G894 Chronic pain syndrome: Secondary | ICD-10-CM | POA: Diagnosis not present

## 2022-09-27 DIAGNOSIS — F1190 Opioid use, unspecified, uncomplicated: Secondary | ICD-10-CM | POA: Diagnosis not present

## 2022-09-27 DIAGNOSIS — Z79620 Long term (current) use of immunosuppressive biologic: Secondary | ICD-10-CM | POA: Diagnosis not present

## 2022-09-27 DIAGNOSIS — D5720 Sickle-cell/Hb-C disease without crisis: Secondary | ICD-10-CM | POA: Diagnosis not present

## 2022-09-27 DIAGNOSIS — G894 Chronic pain syndrome: Secondary | ICD-10-CM | POA: Diagnosis not present

## 2022-10-01 ENCOUNTER — Other Ambulatory Visit: Payer: Self-pay | Admitting: Nurse Practitioner

## 2022-10-03 NOTE — Telephone Encounter (Signed)
Requested medication (s) are due for refill today: routing for review  Requested medication (s) are on the active medication list: no  Last refill:  10/05/16  Future visit scheduled: yes  Notes to clinic:  This refill cannot be delegated.     Requested Prescriptions  Pending Prescriptions Disp Refills   promethazine (PHENERGAN) 25 MG tablet [Pharmacy Med Name: PROMETHAZINE 25 MG TAB] 30 tablet 1    Sig: TAKE ONE TABLET BY MOUTH EVERY 8 HOURS AS NEEDED FOR NAUSEA FOR UP TO 7 DAYS     Not Delegated - Gastroenterology: Antiemetics Failed - 10/01/2022  3:03 PM      Failed - This refill cannot be delegated      Passed - Valid encounter within last 6 months    Recent Outpatient Visits           2 months ago Encounter for annual wellness exam in Medicare patient   [facility] Larae Grooms, NP   5 months ago Hb-SS disease with crisis, unspecified [facility])   [facility] Larae Grooms, NP       Future Appointments             In 3 months Mecum, Oswaldo Conroy, PA-C [place] [facility]

## 2022-10-04 IMAGING — CT CT ABD-PELV W/ CM
2 of 5 series · 15 of 46 positions shown, 17 images · IV contrast (APPLIED)
Comparison: CT chest angiogram, 05/11/2020

CLINICAL DATA: Abdominal pain, vomiting

EXAM:
CT ABDOMEN AND PELVIS WITH CONTRAST
TECHNIQUE: Multidetector CT imaging of the abdomen and pelvis was performed
using the standard protocol following bolus administration of
intravenous contrast.

[Series 2: routine abd/pel with · axial · 0.87mm/px · z∈[-1112,-562]mm · 12 of 124 slices shown, 14 images]
[im 7/124  soft-tissue]
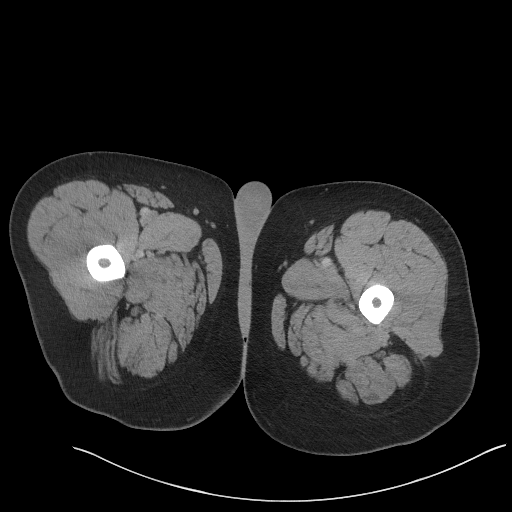
[im 7/124  bone]
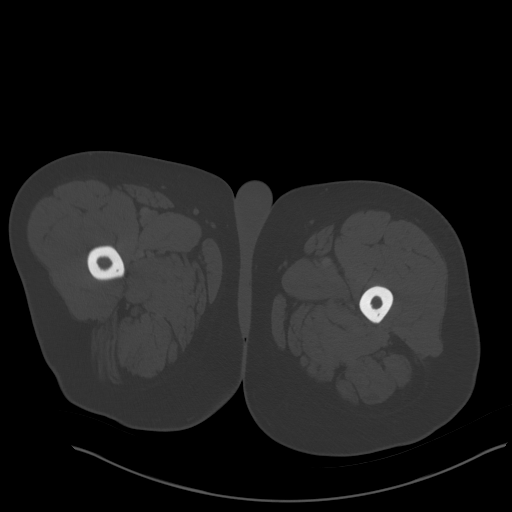
[im 20/124  soft-tissue]
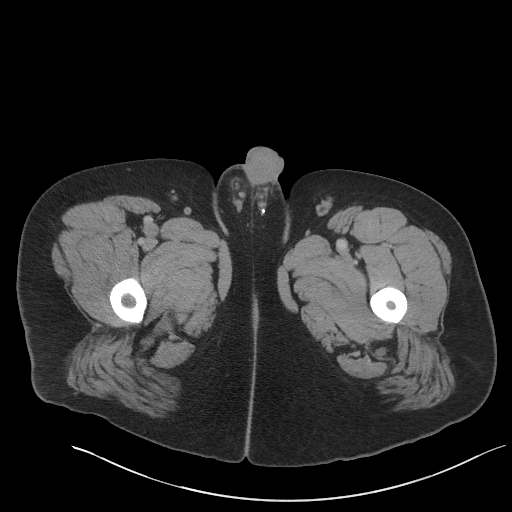
[im 26/124  soft-tissue]
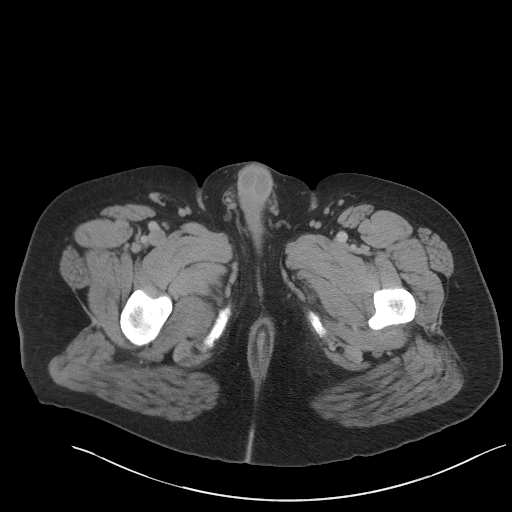
[im 39/124  soft-tissue]
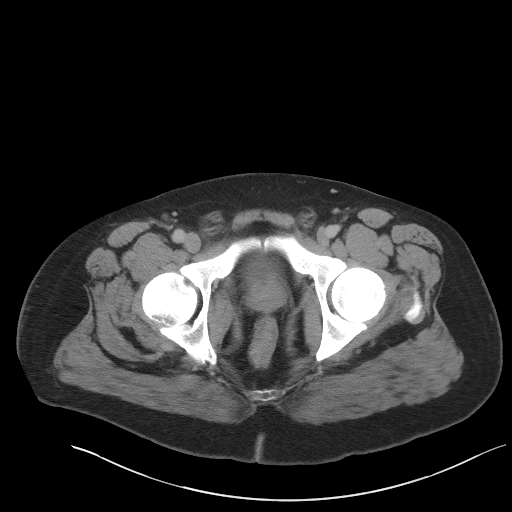
[im 46/124  soft-tissue]
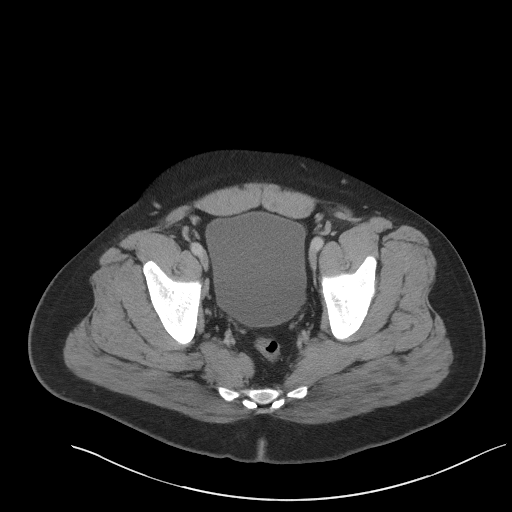
[im 59/124  soft-tissue]
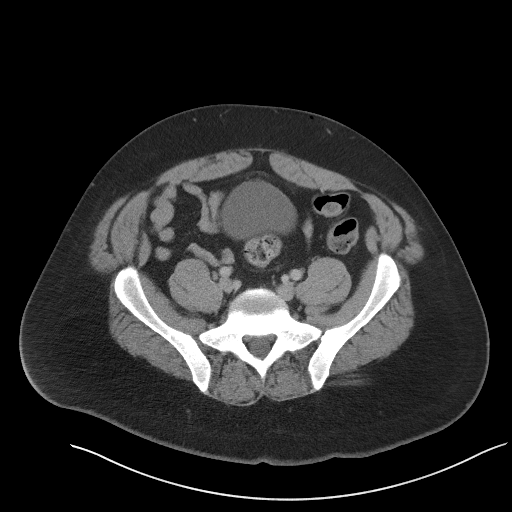
[im 65/124  soft-tissue]
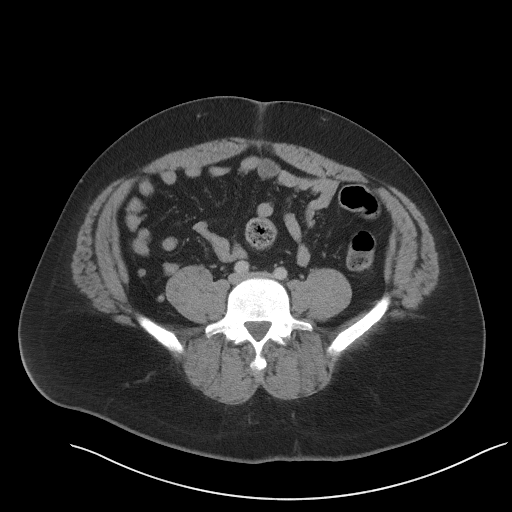
[im 78/124  soft-tissue]
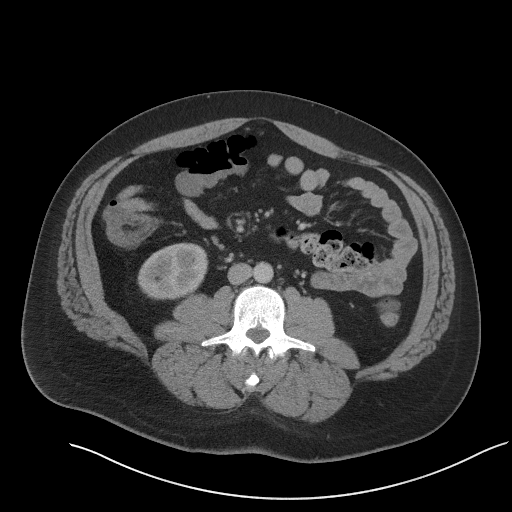
[im 85/124  soft-tissue]
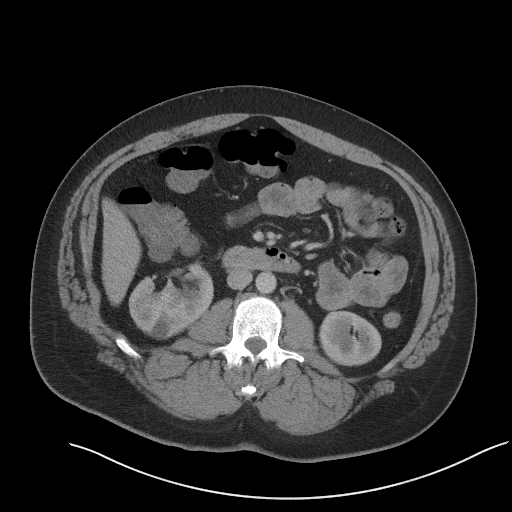
[im 85/124  bone]
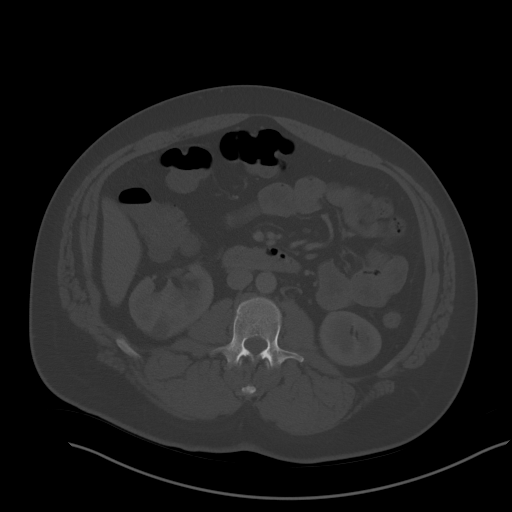
[im 98/124  soft-tissue]
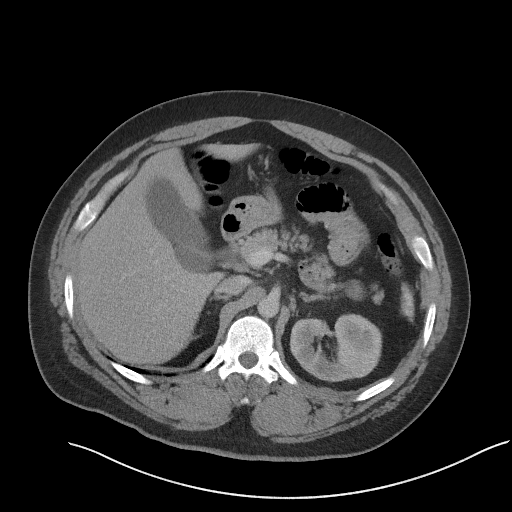
[im 104/124  soft-tissue]
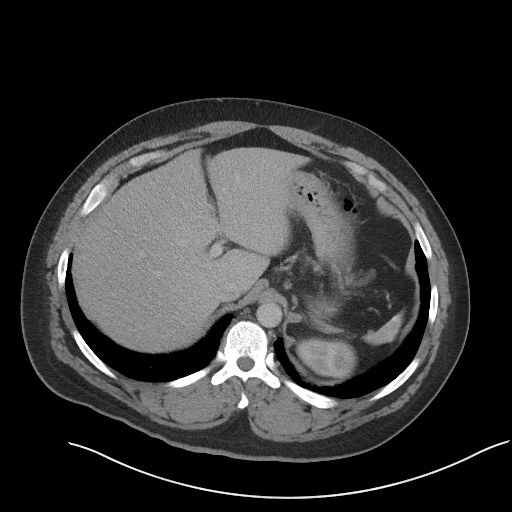
[im 117/124  soft-tissue]
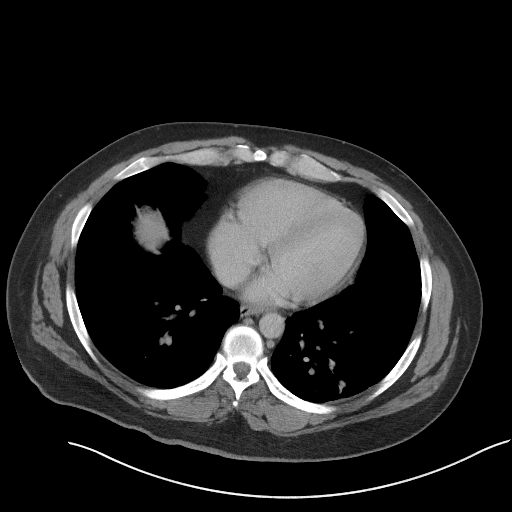

[Series 4: coronal st · coronal · 0.86mm/px · 3 of 112 slices shown]
[im 38/112  soft-tissue]
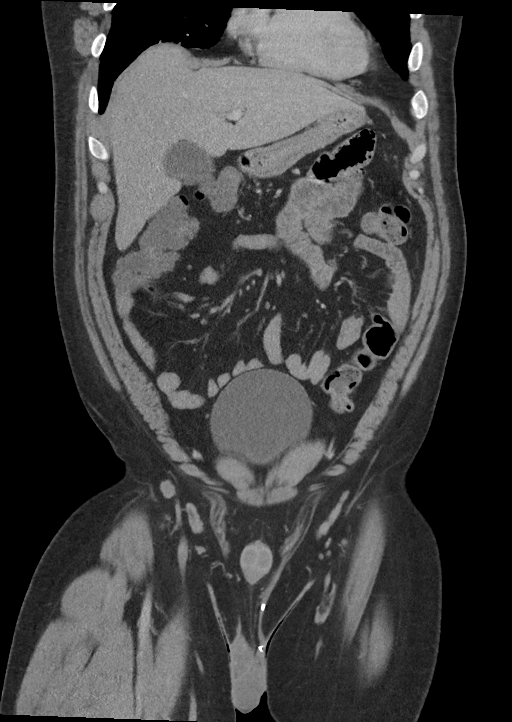
[im 50/112  soft-tissue]
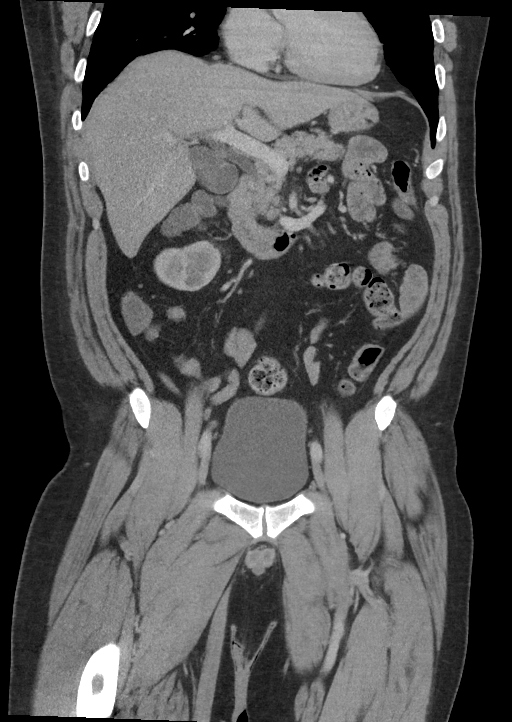
[im 62/112  soft-tissue]
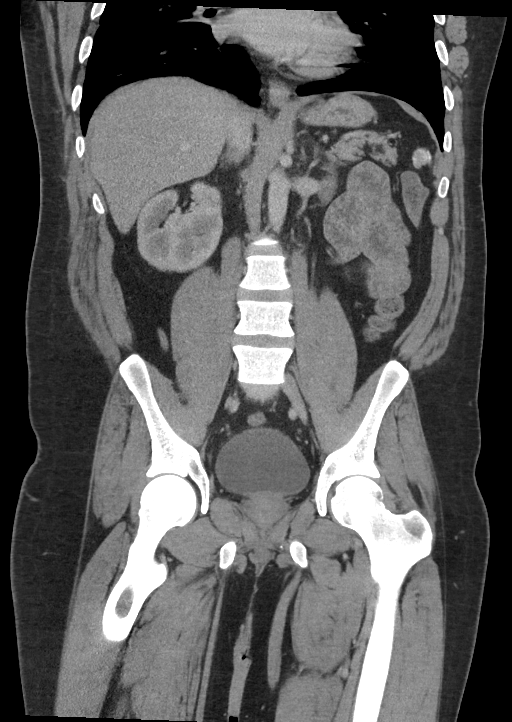

[15 of 46 positions shown; findings below may reference images not displayed]

RADIATION DOSE REDUCTION: This exam was performed according to the
departmental dose-optimization program which includes automated
exposure control, adjustment of the mA and/or kV according to
patient size and/or use of iterative reconstruction technique.

CONTRAST:  100mL OMNIPAQUE IOHEXOL 300 MG/ML  SOLN
FINDINGS: Lower chest: Bandlike scarring of the bilateral lung bases with
scattered associated fine centrilobular and tree-in-bud nodularity
(series 6, image 9)

Hepatobiliary: No solid liver abnormality is seen. No gallstones,
gallbladder wall thickening, or biliary dilatation.

Pancreas: Unremarkable. No pancreatic ductal dilatation or
surrounding inflammatory changes.

Spleen: Involuted spleen.

Adrenals/Urinary Tract: Adrenal glands are unremarkable. Kidneys are
normal, without renal calculi, solid lesion, or hydronephrosis.
Bladder is unremarkable.

Stomach/Bowel: Stomach is within normal limits. Appendix appears
normal. No evidence of bowel wall thickening, distention, or
inflammatory changes.

Vascular/Lymphatic: No significant vascular findings are present. No
enlarged abdominal or pelvic lymph nodes.

Reproductive: No mass or other significant abnormality.

Other: No abdominal wall hernia or abnormality. No ascites.

Musculoskeletal: No acute or significant osseous findings.
IMPRESSION: 1. No acute CT findings of the abdomen or pelvis to explain
abdominal pain or vomiting.
2. Unchanged bandlike scarring of the bilateral lung bases with
scattered associated fine centrilobular and tree-in-bud nodularity,
most consistent with sequelae of aspiration.
3. Involuted spleen, which can be seen in sickle cell disease.

## 2022-10-10 ENCOUNTER — Ambulatory Visit: Payer: Medicare HMO | Admitting: Psychiatry

## 2022-10-19 DIAGNOSIS — D5700 Hb-SS disease with crisis, unspecified: Secondary | ICD-10-CM | POA: Diagnosis not present

## 2022-10-19 DIAGNOSIS — G8929 Other chronic pain: Secondary | ICD-10-CM | POA: Diagnosis not present

## 2022-10-19 DIAGNOSIS — G894 Chronic pain syndrome: Secondary | ICD-10-CM | POA: Diagnosis not present

## 2022-10-19 DIAGNOSIS — M79672 Pain in left foot: Secondary | ICD-10-CM | POA: Diagnosis not present

## 2022-10-19 DIAGNOSIS — M542 Cervicalgia: Secondary | ICD-10-CM | POA: Diagnosis not present

## 2022-10-19 DIAGNOSIS — M25551 Pain in right hip: Secondary | ICD-10-CM | POA: Diagnosis not present

## 2022-10-19 DIAGNOSIS — F1190 Opioid use, unspecified, uncomplicated: Secondary | ICD-10-CM | POA: Diagnosis not present

## 2022-10-19 DIAGNOSIS — Z79891 Long term (current) use of opiate analgesic: Secondary | ICD-10-CM | POA: Diagnosis not present

## 2022-10-19 DIAGNOSIS — M792 Neuralgia and neuritis, unspecified: Secondary | ICD-10-CM | POA: Diagnosis not present

## 2022-10-24 DIAGNOSIS — E559 Vitamin D deficiency, unspecified: Secondary | ICD-10-CM | POA: Diagnosis not present

## 2022-10-24 DIAGNOSIS — F1190 Opioid use, unspecified, uncomplicated: Secondary | ICD-10-CM | POA: Diagnosis not present

## 2022-10-24 DIAGNOSIS — Z6833 Body mass index (BMI) 33.0-33.9, adult: Secondary | ICD-10-CM | POA: Diagnosis not present

## 2022-10-24 DIAGNOSIS — Z7901 Long term (current) use of anticoagulants: Secondary | ICD-10-CM | POA: Diagnosis not present

## 2022-10-24 DIAGNOSIS — R809 Proteinuria, unspecified: Secondary | ICD-10-CM | POA: Diagnosis not present

## 2022-10-24 DIAGNOSIS — D649 Anemia, unspecified: Secondary | ICD-10-CM | POA: Diagnosis not present

## 2022-10-24 DIAGNOSIS — R030 Elevated blood-pressure reading, without diagnosis of hypertension: Secondary | ICD-10-CM | POA: Diagnosis not present

## 2022-10-24 DIAGNOSIS — I2699 Other pulmonary embolism without acute cor pulmonale: Secondary | ICD-10-CM | POA: Diagnosis not present

## 2022-10-24 DIAGNOSIS — E663 Overweight: Secondary | ICD-10-CM | POA: Diagnosis not present

## 2022-10-24 DIAGNOSIS — D5720 Sickle-cell/Hb-C disease without crisis: Secondary | ICD-10-CM | POA: Diagnosis not present

## 2022-11-02 DIAGNOSIS — Z0100 Encounter for examination of eyes and vision without abnormal findings: Secondary | ICD-10-CM | POA: Diagnosis not present

## 2022-11-28 DIAGNOSIS — F1190 Opioid use, unspecified, uncomplicated: Secondary | ICD-10-CM | POA: Diagnosis not present

## 2022-11-28 DIAGNOSIS — G894 Chronic pain syndrome: Secondary | ICD-10-CM | POA: Diagnosis not present

## 2022-11-28 DIAGNOSIS — D5720 Sickle-cell/Hb-C disease without crisis: Secondary | ICD-10-CM | POA: Diagnosis not present

## 2022-12-19 DIAGNOSIS — M792 Neuralgia and neuritis, unspecified: Secondary | ICD-10-CM | POA: Diagnosis not present

## 2022-12-19 DIAGNOSIS — M5416 Radiculopathy, lumbar region: Secondary | ICD-10-CM | POA: Diagnosis not present

## 2022-12-19 DIAGNOSIS — Z79891 Long term (current) use of opiate analgesic: Secondary | ICD-10-CM | POA: Diagnosis not present

## 2022-12-19 DIAGNOSIS — M542 Cervicalgia: Secondary | ICD-10-CM | POA: Diagnosis not present

## 2022-12-19 DIAGNOSIS — G894 Chronic pain syndrome: Secondary | ICD-10-CM | POA: Diagnosis not present

## 2022-12-19 DIAGNOSIS — D5700 Hb-SS disease with crisis, unspecified: Secondary | ICD-10-CM | POA: Diagnosis not present

## 2023-01-17 DIAGNOSIS — D5720 Sickle-cell/Hb-C disease without crisis: Secondary | ICD-10-CM | POA: Diagnosis not present

## 2023-01-17 DIAGNOSIS — Z7289 Other problems related to lifestyle: Secondary | ICD-10-CM | POA: Diagnosis not present

## 2023-01-17 DIAGNOSIS — G894 Chronic pain syndrome: Secondary | ICD-10-CM | POA: Diagnosis not present

## 2023-01-22 ENCOUNTER — Ambulatory Visit: Payer: Medicare HMO | Admitting: Physician Assistant

## 2023-02-07 DIAGNOSIS — E559 Vitamin D deficiency, unspecified: Secondary | ICD-10-CM | POA: Diagnosis not present

## 2023-02-07 DIAGNOSIS — G4733 Obstructive sleep apnea (adult) (pediatric): Secondary | ICD-10-CM | POA: Diagnosis not present

## 2023-02-07 DIAGNOSIS — D571 Sickle-cell disease without crisis: Secondary | ICD-10-CM | POA: Diagnosis not present

## 2023-02-07 DIAGNOSIS — Z23 Encounter for immunization: Secondary | ICD-10-CM | POA: Diagnosis not present

## 2023-02-07 DIAGNOSIS — I10 Essential (primary) hypertension: Secondary | ICD-10-CM | POA: Diagnosis not present

## 2023-02-07 DIAGNOSIS — D5720 Sickle-cell/Hb-C disease without crisis: Secondary | ICD-10-CM | POA: Diagnosis not present

## 2023-02-07 DIAGNOSIS — I272 Pulmonary hypertension, unspecified: Secondary | ICD-10-CM | POA: Diagnosis not present

## 2023-02-07 DIAGNOSIS — N529 Male erectile dysfunction, unspecified: Secondary | ICD-10-CM | POA: Diagnosis not present

## 2023-02-07 DIAGNOSIS — N189 Chronic kidney disease, unspecified: Secondary | ICD-10-CM | POA: Diagnosis not present

## 2023-02-07 DIAGNOSIS — R809 Proteinuria, unspecified: Secondary | ICD-10-CM | POA: Diagnosis not present

## 2023-02-07 DIAGNOSIS — I50810 Right heart failure, unspecified: Secondary | ICD-10-CM | POA: Diagnosis not present

## 2023-02-07 DIAGNOSIS — I130 Hypertensive heart and chronic kidney disease with heart failure and stage 1 through stage 4 chronic kidney disease, or unspecified chronic kidney disease: Secondary | ICD-10-CM | POA: Diagnosis not present

## 2023-02-07 DIAGNOSIS — G43709 Chronic migraine without aura, not intractable, without status migrainosus: Secondary | ICD-10-CM | POA: Diagnosis not present

## 2023-02-08 ENCOUNTER — Ambulatory Visit: Payer: Medicare HMO | Admitting: Physician Assistant

## 2023-02-09 ENCOUNTER — Ambulatory Visit (INDEPENDENT_AMBULATORY_CARE_PROVIDER_SITE_OTHER): Payer: Medicare HMO | Admitting: Physician Assistant

## 2023-02-09 ENCOUNTER — Encounter: Payer: Self-pay | Admitting: Physician Assistant

## 2023-02-09 VITALS — BP 126/85 | HR 76 | Temp 99.0°F | Wt 250.2 lb

## 2023-02-09 DIAGNOSIS — D57219 Sickle-cell/Hb-C disease with crisis, unspecified: Secondary | ICD-10-CM | POA: Diagnosis not present

## 2023-02-09 DIAGNOSIS — K5900 Constipation, unspecified: Secondary | ICD-10-CM | POA: Diagnosis not present

## 2023-02-09 DIAGNOSIS — R1032 Left lower quadrant pain: Secondary | ICD-10-CM

## 2023-02-09 NOTE — Assessment & Plan Note (Signed)
Chronic, ongoing Unsure if pain and symptoms are adequately controlled He is requesting referral to a new Hematology provider with [facility]- referral placed per request Follow up in about 4 weeks to ensure he is getting established

## 2023-02-09 NOTE — Progress Notes (Signed)
Acute Office Visit   Patient: Joel Leblanc   DOB: 1980/11/08   42 y.o. Male  MRN: [id number] Visit Date: 02/09/2023  Today's healthcare provider: Oswaldo Conroy Tyshawn Keel, PA-C  Introduced myself to the patient as a Secondary school teacher and provided education on APPs in clinical practice.    Chief Complaint  Patient presents with   Abdominal Pain    Pt states it feels like a blockage where he has previously had surgery    Subjective    HPI HPI     Abdominal Pain    Additional comments: Pt states it feels like a blockage where he has previously had surgery       Last edited by Andre Lefort, CMA on 02/09/2023  8:31 AM.        Abdominal pain  He reports he has had his gallbladder removed and states that every time he eats since the surgery he feels a knot in epigastric area He states the knot is painful - has not gotten worse but is persistent  He reports constipation - reports last bowel movement was 3 days ago  He has been passing gas- he states his Linzess only seems to release a large amount liquid stool but he feels there is still a blockage or unpassed stool burden He has been taking an OTC laxative but this has not provided relief- Oxy-tone (from internet- has magnesium, potassium and vitamin C)  He has also tried Miralax but this did not provide relief   He reports he is only eating twice a week- mostly vegetarian, does not eat meat at all - due to fears of constipation since his gallbladder surgery.   He is requesting  referral to a new sickle cell provider with [facility]    Medications: Outpatient Medications Prior to Visit  Medication Sig   albuterol (PROVENTIL HFA;VENTOLIN HFA) 108 (90 Base) MCG/ACT inhaler Inhale 2 puffs into the lungs every 4 (four) hours as needed for wheezing or shortness of breath.    aluminum-magnesium hydroxide-simethicone (MAALOX) 200-200-20 MG/5ML SUSP Take 30 mLs by mouth 4 (four) times daily -  before meals and at bedtime.   apixaban  (ELIQUIS) 5 MG TABS tablet Take 1 tablet (5 mg total) by mouth 2 (two) times daily.   budesonide-formoterol (SYMBICORT) 160-4.5 MCG/ACT inhaler Inhale 2 puffs into the lungs in the morning and at bedtime.    cetirizine (ZYRTEC) 10 MG tablet Take 1 tablet (10 mg total) by mouth daily.   cyclobenzaprine (FLEXERIL) 10 MG tablet Take 10 mg by mouth 3 (three) times daily.   EMGALITY 120 MG/ML SOAJ Inject 2 mLs into the skin every 30 (thirty) days.   folic acid (FOLVITE) 800 MCG tablet Take 800 mcg by mouth daily.   ipratropium (ATROVENT) 0.06 % nasal spray Place 2 sprays into both nostrils 4 (four) times daily.   lidocaine-prilocaine (EMLA) cream Apply 1 Application topically in the morning and at bedtime.   LINZESS 72 MCG capsule Take 72 mcg by mouth every morning.   methadone (DOLOPHINE) 10 MG tablet Take 10 mg by mouth 2 (two) times daily.    metoCLOPramide (REGLAN) 10 MG tablet Take 1 tablet (10 mg total) by mouth every 6 (six) hours as needed.   Multiple Vitamin (MULTI-VITAMIN) tablet Take 1 tablet by mouth daily.   NURTEC 75 MG TBDP Take 1 tablet by mouth as needed.   ondansetron (ZOFRAN-ODT) 4 MG disintegrating tablet Take 1 tablet (4 mg total)  by mouth every 8 (eight) hours as needed for nausea or vomiting.   Oxycodone HCl 20 MG TABS Take 1 tablet by mouth 4 (four) times daily as needed.   pregabalin (LYRICA) 200 MG capsule Take 200 mg by mouth 3 (three) times daily.   promethazine (PHENERGAN) 25 MG tablet TAKE ONE TABLET BY MOUTH EVERY 8 HOURS AS NEEDED FOR NAUSEA FOR UP TO 7 DAYS   traZODone (DESYREL) 50 MG tablet Take 50 mg by mouth as needed for sleep.    triamcinolone cream (KENALOG) 0.1 % Apply 1 Application topically 2 (two) times daily.   diphenhydrAMINE (BENADRYL) 12.5 MG/5ML liquid Take 20 mLs (50 mg total) by mouth every 6 (six) hours for 3 days.   sildenafil (VIAGRA) 50 MG tablet Take 50 mg by mouth as needed.   No facility-administered medications prior to visit.    Review of  Systems  Constitutional:  Negative for chills, diaphoresis and fever.  Gastrointestinal:  Positive for abdominal distention (feels bloated and epigastric area feels tight), abdominal pain and constipation. Negative for blood in stool, nausea and vomiting.  Neurological:  Positive for headaches (hx of migraines). Negative for dizziness and light-headedness.        Objective    BP 126/85   Pulse 76   Temp 99 F (37.2 C) (Oral)   Wt 250 lb 3.2 oz (113.5 kg)   SpO2 93%   BMI 34.90 kg/m     Physical Exam Vitals reviewed.  Constitutional:      General: He is awake.     Appearance: Normal appearance. He is well-developed and well-groomed.  HENT:     Head: Normocephalic and atraumatic.  Cardiovascular:     Rate and Rhythm: Normal rate and regular rhythm.     Pulses: Normal pulses.     Heart sounds: Normal heart sounds. No murmur heard.    No friction rub. No gallop.  Pulmonary:     Effort: Pulmonary effort is normal.     Breath sounds: Normal breath sounds. No decreased air movement. No decreased breath sounds, wheezing, rhonchi or rales.  Abdominal:     General: Abdomen is flat. A surgical scar is present. Bowel sounds are normal.     Tenderness: There is abdominal tenderness in the left lower quadrant.     Hernia: No hernia is present.       Comments: Mildly hard over left quadrant but not tight He reports TTP along left side   Musculoskeletal:     Cervical back: Normal range of motion.  Skin:    General: Skin is warm and dry.  Neurological:     General: No focal deficit present.     Mental Status: He is alert and oriented to person, place, and time.  Psychiatric:        Mood and Affect: Mood normal.        Behavior: Behavior normal. Behavior is cooperative.        Thought Content: Thought content normal.        Judgment: Judgment normal.       No results found for any visits on 02/09/23.  Assessment & Plan      Return in about 4 weeks (around 03/09/2023) for  sickle cell, asthma, .      Problem List Items Addressed This Visit       Other   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HCC) (Chronic)    Chronic, ongoing Unsure if pain and symptoms are adequately controlled He is requesting referral to  a new Hematology provider with [facility]- referral placed per request Follow up in about 4 weeks to ensure he is getting established       Relevant Orders   Ambulatory referral to Hematology / Oncology   Other Visit Diagnoses     Left lower quadrant abdominal pain    -  Primary Acute, ongoing  He reports LLQ abdominal pain, constipation for the past few days Suspect he may have fecal impaction given TTP and palpation of LLQ was not as soft as expected Cannot rule out developing bowel obstruction Recommend he goes to ED for disimpaction and further evaluation given home measures and Linzess are not improving symptoms  Follow up as needed for persistent or progressing symptoms     Constipation, unspecified constipation type     Appears to be chronic since gallbladder was removed He reports he is only eating twice per week and is not eating meat due to concerns for constipation  Suspect slowed gastric motility along with opioid pain medication are contributing to symptoms Recommend he eats regularly every day with increased fiber content once his current flare is relieved and continues to take Linzess I do not recommend he continues to take supplements from internet as these may interfere with electrolytes and cause issues Follow up as needed for persistent or progressing symptoms          Return in about 4 weeks (around 03/09/2023) for sickle cell, asthma, .   I, Ranny Wiebelhaus E Jrake Rodriquez, PA-C, have reviewed all documentation for this visit. The documentation on 02/09/23 for the exam, diagnosis, procedures, and orders are all accurate and complete.   Jacquelin Hawking, MHS, PA-C [facility]

## 2023-03-08 DIAGNOSIS — Z79891 Long term (current) use of opiate analgesic: Secondary | ICD-10-CM | POA: Diagnosis not present

## 2023-03-08 DIAGNOSIS — Z5181 Encounter for therapeutic drug level monitoring: Secondary | ICD-10-CM | POA: Diagnosis not present

## 2023-03-08 DIAGNOSIS — G894 Chronic pain syndrome: Secondary | ICD-10-CM | POA: Diagnosis not present

## 2023-03-08 DIAGNOSIS — D5700 Hb-SS disease with crisis, unspecified: Secondary | ICD-10-CM | POA: Diagnosis not present

## 2023-03-08 DIAGNOSIS — M792 Neuralgia and neuritis, unspecified: Secondary | ICD-10-CM | POA: Diagnosis not present

## 2023-03-30 DIAGNOSIS — E559 Vitamin D deficiency, unspecified: Secondary | ICD-10-CM | POA: Diagnosis not present

## 2023-03-30 DIAGNOSIS — F1190 Opioid use, unspecified, uncomplicated: Secondary | ICD-10-CM | POA: Diagnosis not present

## 2023-03-30 DIAGNOSIS — G8929 Other chronic pain: Secondary | ICD-10-CM | POA: Diagnosis not present

## 2023-03-30 DIAGNOSIS — D57219 Sickle-cell/Hb-C disease with crisis, unspecified: Secondary | ICD-10-CM | POA: Diagnosis not present

## 2023-04-27 DIAGNOSIS — D5720 Sickle-cell/Hb-C disease without crisis: Secondary | ICD-10-CM | POA: Diagnosis not present

## 2023-04-27 DIAGNOSIS — G894 Chronic pain syndrome: Secondary | ICD-10-CM | POA: Diagnosis not present

## 2023-04-27 DIAGNOSIS — M792 Neuralgia and neuritis, unspecified: Secondary | ICD-10-CM | POA: Diagnosis not present

## 2023-04-27 DIAGNOSIS — D5700 Hb-SS disease with crisis, unspecified: Secondary | ICD-10-CM | POA: Diagnosis not present

## 2023-05-03 DIAGNOSIS — Z0189 Encounter for other specified special examinations: Secondary | ICD-10-CM | POA: Diagnosis not present

## 2023-05-03 DIAGNOSIS — D5720 Sickle-cell/Hb-C disease without crisis: Secondary | ICD-10-CM | POA: Diagnosis not present

## 2023-05-04 DIAGNOSIS — F332 Major depressive disorder, recurrent severe without psychotic features: Secondary | ICD-10-CM | POA: Diagnosis not present

## 2023-05-04 DIAGNOSIS — G894 Chronic pain syndrome: Secondary | ICD-10-CM | POA: Diagnosis not present

## 2023-05-04 DIAGNOSIS — K439 Ventral hernia without obstruction or gangrene: Secondary | ICD-10-CM | POA: Diagnosis not present

## 2023-05-04 DIAGNOSIS — R1013 Epigastric pain: Secondary | ICD-10-CM | POA: Diagnosis not present

## 2023-05-04 DIAGNOSIS — D5700 Hb-SS disease with crisis, unspecified: Secondary | ICD-10-CM | POA: Diagnosis not present

## 2023-05-04 DIAGNOSIS — K5900 Constipation, unspecified: Secondary | ICD-10-CM | POA: Diagnosis not present

## 2023-05-04 DIAGNOSIS — R635 Abnormal weight gain: Secondary | ICD-10-CM | POA: Diagnosis not present

## 2023-06-01 DIAGNOSIS — K439 Ventral hernia without obstruction or gangrene: Secondary | ICD-10-CM | POA: Diagnosis not present

## 2023-06-01 DIAGNOSIS — R1013 Epigastric pain: Secondary | ICD-10-CM | POA: Diagnosis not present

## 2023-06-01 DIAGNOSIS — G4700 Insomnia, unspecified: Secondary | ICD-10-CM | POA: Diagnosis not present

## 2023-06-01 DIAGNOSIS — F332 Major depressive disorder, recurrent severe without psychotic features: Secondary | ICD-10-CM | POA: Diagnosis not present

## 2023-06-01 DIAGNOSIS — K5900 Constipation, unspecified: Secondary | ICD-10-CM | POA: Diagnosis not present

## 2023-06-01 DIAGNOSIS — F988 Other specified behavioral and emotional disorders with onset usually occurring in childhood and adolescence: Secondary | ICD-10-CM | POA: Diagnosis not present

## 2023-06-07 DIAGNOSIS — Z0100 Encounter for examination of eyes and vision without abnormal findings: Secondary | ICD-10-CM | POA: Diagnosis not present

## 2023-06-08 DIAGNOSIS — D5720 Sickle-cell/Hb-C disease without crisis: Secondary | ICD-10-CM | POA: Diagnosis not present

## 2023-06-12 DIAGNOSIS — D5700 Hb-SS disease with crisis, unspecified: Secondary | ICD-10-CM | POA: Diagnosis not present

## 2023-06-12 DIAGNOSIS — M5416 Radiculopathy, lumbar region: Secondary | ICD-10-CM | POA: Diagnosis not present

## 2023-06-12 DIAGNOSIS — R519 Headache, unspecified: Secondary | ICD-10-CM | POA: Diagnosis not present

## 2023-06-12 DIAGNOSIS — G4733 Obstructive sleep apnea (adult) (pediatric): Secondary | ICD-10-CM | POA: Diagnosis not present

## 2023-06-12 DIAGNOSIS — G4440 Drug-induced headache, not elsewhere classified, not intractable: Secondary | ICD-10-CM | POA: Diagnosis not present

## 2023-06-12 DIAGNOSIS — R4189 Other symptoms and signs involving cognitive functions and awareness: Secondary | ICD-10-CM | POA: Diagnosis not present

## 2023-06-22 DIAGNOSIS — M791 Myalgia, unspecified site: Secondary | ICD-10-CM | POA: Diagnosis not present

## 2023-06-22 DIAGNOSIS — M792 Neuralgia and neuritis, unspecified: Secondary | ICD-10-CM | POA: Diagnosis not present

## 2023-06-22 DIAGNOSIS — D5700 Hb-SS disease with crisis, unspecified: Secondary | ICD-10-CM | POA: Diagnosis not present

## 2023-06-22 DIAGNOSIS — G894 Chronic pain syndrome: Secondary | ICD-10-CM | POA: Diagnosis not present

## 2023-11-15 ENCOUNTER — Encounter: Attending: Pulmonary Disease | Admitting: *Deleted

## 2023-11-15 DIAGNOSIS — R0609 Other forms of dyspnea: Secondary | ICD-10-CM

## 2023-11-15 NOTE — Progress Notes (Signed)
 Virtual orientation call completed today. he has an appointment on Date: 11/19/2023 for EP eval and gym Orientation.  Documentation of diagnosis can be found in [facility] 10/17/2023 .

## 2023-11-19 ENCOUNTER — Ambulatory Visit
